# Patient Record
Sex: Female | Born: 1950 | Race: White | Hispanic: No | Marital: Married | Smoking: Former smoker
Health system: Southern US, Community
[De-identification: ages and names within clinical notes are randomized; demographics above are authoritative.]

## PROBLEM LIST (undated history)

## (undated) DIAGNOSIS — I6523 Occlusion and stenosis of bilateral carotid arteries: Secondary | ICD-10-CM

## (undated) DIAGNOSIS — K219 Gastro-esophageal reflux disease without esophagitis: Secondary | ICD-10-CM

## (undated) DIAGNOSIS — I350 Nonrheumatic aortic (valve) stenosis: Secondary | ICD-10-CM

## (undated) DIAGNOSIS — K449 Diaphragmatic hernia without obstruction or gangrene: Secondary | ICD-10-CM

## (undated) DIAGNOSIS — E785 Hyperlipidemia, unspecified: Secondary | ICD-10-CM

## (undated) DIAGNOSIS — J189 Pneumonia, unspecified organism: Secondary | ICD-10-CM

## (undated) DIAGNOSIS — G629 Polyneuropathy, unspecified: Secondary | ICD-10-CM

## (undated) HISTORY — DX: Gastro-esophageal reflux disease without esophagitis: K21.9

## (undated) HISTORY — DX: Diaphragmatic hernia without obstruction or gangrene: K44.9

## (undated) HISTORY — DX: Occlusion and stenosis of bilateral carotid arteries: I65.23

## (undated) HISTORY — DX: Hyperlipidemia, unspecified: E78.5

## (undated) HISTORY — DX: Polyneuropathy, unspecified: G62.9

## (undated) HISTORY — DX: Nonrheumatic aortic (valve) stenosis: I35.0

---

## 1983-01-30 HISTORY — PX: TUBAL LIGATION: SHX77

## 2007-06-12 ENCOUNTER — Ambulatory Visit: Payer: Self-pay | Admitting: Gastroenterology

## 2007-06-24 ENCOUNTER — Ambulatory Visit (HOSPITAL_COMMUNITY): Admission: RE | Admit: 2007-06-24 | Discharge: 2007-06-24 | Payer: Self-pay | Admitting: Gastroenterology

## 2007-06-24 ENCOUNTER — Ambulatory Visit: Payer: Self-pay | Admitting: Gastroenterology

## 2007-06-24 ENCOUNTER — Encounter: Payer: Self-pay | Admitting: Gastroenterology

## 2010-06-13 NOTE — Consult Note (Signed)
[name], [name]               ACCOUNT NO.:  [id number]   MEDICAL RECORD NO.:  [id number]          PATIENT TYPE:  AMB   LOCATION:  DAY                           FACILITY:  [facility]   PHYSICIAN:  Kassie Mends, M.D.      DATE OF BIRTH:  11/26/1950   DATE OF CONSULTATION:  DATE OF DISCHARGE:                                 CONSULTATION   REQUESTING PHYSICIAN:  Dr. Sherryll Burger.   CHIEF COMPLAINTS:  Rectal bleeding, heartburn, and indigestion.   HISTORY OF PRESENT ILLNESS:  [name] is a 60 year old female.  Approximately 2 months ago, she developed a large amount of bright red  rectal bleeding.  This was during an acute illness where she had  diarrhea as well as left-sided abdominal pain, nausea, vomiting, severe  heartburn, and indigestion which lasted several weeks.  She is doing  much better now.  However, she has still had Hemoccult positive stool  through Dr. Margaretmary Eddy office.  She is complaining of some nausea as well as  heartburn and indigestion currently.  She denies any dysphagia or  odynophagia.  She has had some anorexia.  She has been taking Tums on an  as-needed basis, which do seem to help some.  She tells me her bowel  movements alternate between what she calls constipation and diarrhea,  but generally she has a bowel movement within every 2 days or less than  5 bowel movements per day.  She has never had a colonoscopy.   PAST MEDICAL AND SURGICAL HISTORY:  She had pneumonia, which required  hospitalization and tubal ligation.   CURRENT MEDICATIONS:  1. Aspirin 81 mg, just discontinued.  2. Calcium 500 mg when she remembers to take it.   ALLERGIES:  ASPIRIN closes my throat up   FAMILY HISTORY:  There is no known family history of carcinoma or other  chronic GI problems.  Mother deceased in her 43s due to ovarian cancer.  Father deceased in his 73s secondary to MI.  She has 3 healthy brothers.   SOCIAL HISTORY:  [name] is married.  She has 2 healthy children.  She  works third shift at [employer].  She has a 40 pack-year history of  tobacco use.  Denies any alcohol or drug use.   REVIEW OF SYSTEMS:  See HPI.  GU:  She did have hematuria, but tells me  this has resolved and denies any dysuria or increased urinary frequency.  Otherwise, negative review of systems.  See HPI.   PHYSICAL EXAMINATION:  VITAL SIGNS:  Weight 104 pounds, height is 61  inches, temperature 98.2, blood pressure 104/60, and pulse 88.  GENERAL:  She is a well-developed and well-nourished Caucasian female,  in no acute distress.  HEENT:  Sclerae clear and nonicteric.  Conjunctivae are pink.  Oropharynx is pink and moist without lesions.  NECK:  Supple without any mass or thyromegaly.  CHEST:  Heart regular rhythm.  Normal S1 and S2.  No murmurs, clicks,  rubs, or gallops.  LUNGS:  Clear to auscultation bilaterally.  ABDOMEN:  Positive bowel sounds x4.  No bruits auscultated.  Soft,  nontender, and nondistended without palpable masses or hepatomegaly.  No  rebound tenderness or guarding.  EXTREMITIES:  Without clubbing or edema.   Laboratory studies from May 02, 2007, she had a normal complete  metabolic panel except for CO2 of 29, (normal LFTs.)  She had a CBC on  May 02, 2007, which was normal.   IMPRESSION:  [name] is a 60 year old female with a history of large  volume intermittent rectal bleeding which is going to require further  evaluation.  Her symptoms of nausea, left lower quadrant abdominal pain,  heartburn, indigestion, and anorexia all began about 5 months ago, when  she developed acute illness and she has had lingering symptoms since  that time.  She is going to require further evaluation to rule out  colorectal carcinoma.  There is no point tenderness or anything to  suggest diverticulitis on exam.  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  However, this does not explain her rectal bleeding,  and she should undergo colonoscopy to rule out  inflammatory bowel  disease as well as colorectal carcinoma.   She has had some worsening heartburn and indigestion, acute illness as  well as chronic nausea, so she should undergo EGD to rule out erosive  esophagitis/gastritis and peptic ulcer disease.   PLAN:  1. Begin omeprazole 20 mg daily, #31 with one refill.  2. Colonoscopy and EGD with Dr. Cira Servant in the near future.  I have      discussed both procedures including risks and benefits including      but not limited to infection, perforation, and drug reaction.  She      agrees with the plan and consent will be obtained.   Thank you Dr. Sherryll Burger for allowing [place] to participate in the care of Ms.  Foley.      Lorenza Burton, N.P.      Kassie Mends, M.D.  Electronically Signed    KJ/MEDQ  D:  06/12/2007  T:  06/13/2007  Job:  [id number]   cc:   Kirstie Peri, MD  Fax: [phone]

## 2010-06-13 NOTE — Consult Note (Signed)
[name], [name]               ACCOUNT NO.:  [id number]   MEDICAL RECORD NO.:  [id number]          PATIENT TYPE:  AMB   LOCATION:  DAY                           FACILITY:  [facility]   PHYSICIAN:  Lorenza Burton, N.P.    DATE OF BIRTH:  11-Dec-1950   DATE OF CONSULTATION:  DATE OF DISCHARGE:                                 CONSULTATION   REQUESTING PHYSICIAN:  Dr. Sherryll Burger.   CHIEF COMPLAINTS:  Rectal bleeding, heartburn, and indigestion.   HISTORY OF PRESENT ILLNESS:  [name] is a 60 year old female.  Approximately 2 months ago, she developed a large amount of bright red  rectal bleeding.  This was during an acute illness where she had  diarrhea as well as left-sided abdominal pain, nausea, vomiting, severe  heartburn, and indigestion which lasted several weeks.  She is doing  much better now.  However, she has still had Hemoccult positive stool  through Dr. Margaretmary Eddy office.  She is complaining of some nausea as well as  heartburn and indigestion currently.  She denies any dysphagia or  odynophagia.  She has had some anorexia.  She has been taking Tums on an  as-needed basis, which do seem to help some.  She tells me her bowel  movements alternate between what she calls constipation and diarrhea,  but generally she has a bowel movement within every 2 days or less than  5 bowel movements per day.  She has never had a colonoscopy.   PAST MEDICAL AND SURGICAL HISTORY:  She had pneumonia, which required  hospitalization and tubal ligation.   CURRENT MEDICATIONS:  1. Aspirin 81 mg, just discontinued.  2. Calcium 500 mg when she remembers to take it.   ALLERGIES:  ASPIRIN closes my throat up   FAMILY HISTORY:  There is no known family history of carcinoma or other  chronic GI problems.  Mother deceased in her 39s due to ovarian cancer.  Father deceased in his 72s secondary to MI.  She has 3 healthy brothers.   SOCIAL HISTORY:  [name] is married.  She has 2 healthy children.  She  works third shift at [employer].  She has a 40 pack-year history of  tobacco use.  Denies any alcohol or drug use.   REVIEW OF SYSTEMS:  See HPI.  GU:  She did have hematuria, but tells me  this has resolved and denies any dysuria or increased urinary frequency.  Otherwise, negative review of systems.  See HPI.   PHYSICAL EXAMINATION:  VITAL SIGNS:  Weight 104 pounds, height is 61  inches, temperature 98.2, blood pressure 104/60, and pulse 88.  GENERAL:  She is a well-developed and well-nourished Caucasian female,  in no acute distress.  HEENT:  Sclerae clear and nonicteric.  Conjunctivae are pink.  Oropharynx is pink and moist without lesions.  NECK:  Supple without any mass or thyromegaly.  CHEST:  Heart regular rhythm.  Normal S1 and S2.  No murmurs, clicks,  rubs, or gallops.  LUNGS:  Clear to auscultation bilaterally.  ABDOMEN:  Positive bowel sounds x4.  No bruits auscultated.  Soft,  nontender, and nondistended without palpable masses or hepatomegaly.  No  rebound tenderness or guarding.  EXTREMITIES:  Without clubbing or edema.   Laboratory studies from May 02, 2007, she had a normal complete  metabolic panel except for CO2 of 29, (normal LFTs.)  She had a CBC on  May 02, 2007, which was normal.   IMPRESSION:  [name] is a 60 year old female with a history of large  volume intermittent rectal bleeding which is going to require further  evaluation.  Her symptoms of nausea, left lower quadrant abdominal pain,  heartburn, indigestion, and anorexia all began about 5 months ago, when  she developed acute illness and she has had lingering symptoms since  that time.  She is going to require further evaluation to rule out  colorectal carcinoma.  There is no point tenderness or anything to  suggest diverticulitis on exam.  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  However, this does not explain her rectal bleeding,  and she should undergo colonoscopy to rule out  inflammatory bowel  disease as well as colorectal carcinoma.   She has had some worsening heartburn and indigestion, acute illness as  well as chronic nausea, so she should undergo EGD to rule out erosive  esophagitis/gastritis and peptic ulcer disease.   PLAN:  1. Begin omeprazole 20 mg daily, #31 with one refill.  2. Colonoscopy and EGD with Dr. Cira Servant in the near future.  I have      discussed both procedures including risks and benefits including      but not limited to infection, perforation, and drug reaction.  She      agrees with the plan and consent will be obtained.   Thank you Dr. Sherryll Burger for allowing [place] to participate in the care of Ms.  Foley.      Lorenza Burton, N.P.     KJ/MEDQ  D:  06/12/2007  T:  06/13/2007  Job:  [id number]   cc:   Kirstie Peri, MD  Fax: [phone]

## 2010-06-13 NOTE — Op Note (Signed)
[name], ENT             ACCOUNT NO.:  [id number]   MEDICAL RECORD NO.:  [id number]          PATIENT TYPE:  AMB   LOCATION:  DAY                           FACILITY:  [facility]   PHYSICIAN:  Kassie Mends, M.D.      DATE OF BIRTH:  [birth year]   DATE OF PROCEDURE:  06/24/2007  DATE OF DISCHARGE:                                PROCEDURE NOTE   REFERRING Coron Rossano:  Kirstie Peri, MD   PROCEDURE:  1. Sigmoidoscopy with cold forceps polypectomy.  2. Esophagogastroduodenoscopy with cold forceps biopsy.   INDICATION FOR EXAM:  Ms. Sorrels is a 60 year old female who presented  with rectal bleeding.  She was also complaining of left-sided abdominal  pain, nausea, vomiting, severe heartburn, and indigestion.   FINDINGS:  1. The scope was advanced to the distal transverse colon.  The patient      had severe agitation with advancing the scope, but was adequately      sedated when the scope was not moving.  Also with advancing the      scope, her heart rate dropped to 47 and her systolic blood pressure      was 75-85.  No additional medication could be given for comfort.  2. A 3-mm sessile sigmoid colon polyp removed via cold forceps.  Small      internal hemorrhoids.  Otherwise no masses, inflammatory changes,      or diverticular AVMs seen.  3. Normal esophagus without evidence of Barrett's, mass, erosion,      ulceration, or stricture.  4. A 2-cm sliding hiatal hernia.  5. Patchy erythema in the body and the antrum.  Biopsies obtained via      cold forceps to evaluate for H. pylori gastritis.  6. Normal duodenal bulb and second portion of the duodenum.   DIAGNOSES:  1. Ms. Sublett rectal bleeding is likely secondary to hemorrhoids.  2. Sigmoid colon polyp.  3. Mild gastritis.   RECOMMENDATIONS:  1. No aspirin, NSAIDs, or anticoagulation for 5 days.  She should      continue the omeprazole 20 mg, 30 minutes before her first meal.  2. We will call Ms. Oak with the results of  her biopsies.  If her      polyp is adenomatous then recommend a colonoscopy within the next 3-      6 months with a MiraLax bowel prep to complete evaluation of her      colon.  The alternative would be a double-contrast barium enema.  3. Her next endoscopy, upper or lower should be performed with      propofol.  4. She should follow a high fiber diet.  She was given a handout on      high-fiber diet, reflux, gastritis, and hemorrhoids.   MEDICATIONS:  1. Demerol 50 mg IV.  2. Versed 5 mg IV.   PROCEDURE TECHNIQUE:  Physical exam was performed.  Informed consent was  obtained from the patient after explaining the benefits, risks, and  alternatives to the procedure.  The patient was connected to the monitor  and placed in left lateral position.  Continuous oxygen was provided by  nasal cannula, IV medicine administered through an indwelling cannula.  After administration of sedation and rectal exam, the patient's rectum  was intubated and the scope advanced under direct visualization to the  distal transverse colon.  Due to the patient discomfort and in spite of  adequate sedation, the colonoscopy was changed to a sigmoidoscopy.  The  scope was removed slowly by carefully examining the color, texture,  anatomy, and integrity of the mucosa on the way out.   After the colonoscopy, the patient's esophagus was intubated with a  diagnostic gastroscope.  She had significant gagging with passing the  scope through her upper esophageal sphincter into her stomach.  Her eyes  remained closed during the exam.  The scope was advanced under direct  visualization to the second portion of the duodenum.  The scope was  removed slowly by carefully examining the color, texture, anatomy, and  integrity of the mucosa on the way out.  The patient was recovered in  endoscopy and discharged home in satisfactory condition.   PATH:  Simple adenoma. TCS in 3-6 momths with propofol. Gastric  biopsies: no H.  pylori. HF Diet & Omperazole.      Kassie Mends, M.D.  Electronically Signed     SM/MEDQ  D:  06/24/2007  T:  06/25/2007  Job:  [id number]   cc:   Kirstie Peri, MD  Fax: [phone]

## 2011-05-08 ENCOUNTER — Emergency Department (HOSPITAL_COMMUNITY): Payer: Self-pay

## 2011-05-08 ENCOUNTER — Observation Stay (HOSPITAL_COMMUNITY)
Admission: EM | Admit: 2011-05-08 | Discharge: 2011-05-10 | Disposition: A | Discharge status: Home / Self Care | Payer: Self-pay | Attending: Orthopedic Surgery | Admitting: Orthopedic Surgery

## 2011-05-08 ENCOUNTER — Encounter (HOSPITAL_COMMUNITY)
Admission: EM | Disposition: A | Discharge status: Home / Self Care | Payer: Self-pay | Source: Home / Self Care | Attending: Emergency Medicine

## 2011-05-08 ENCOUNTER — Emergency Department (HOSPITAL_COMMUNITY): Payer: Self-pay | Admitting: Anesthesiology

## 2011-05-08 ENCOUNTER — Encounter (HOSPITAL_COMMUNITY): Payer: Self-pay | Admitting: Anesthesiology

## 2011-05-08 ENCOUNTER — Encounter (HOSPITAL_COMMUNITY): Payer: Self-pay | Admitting: General Practice

## 2011-05-08 DIAGNOSIS — W19XXXA Unspecified fall, initial encounter: Secondary | ICD-10-CM | POA: Insufficient documentation

## 2011-05-08 DIAGNOSIS — S82009A Unspecified fracture of unspecified patella, initial encounter for closed fracture: Principal | ICD-10-CM | POA: Insufficient documentation

## 2011-05-08 DIAGNOSIS — S91209A Unspecified open wound of unspecified toe(s) with damage to nail, initial encounter: Secondary | ICD-10-CM

## 2011-05-08 DIAGNOSIS — R110 Nausea: Secondary | ICD-10-CM | POA: Insufficient documentation

## 2011-05-08 HISTORY — PX: ORIF PATELLA: SHX5033

## 2011-05-08 HISTORY — DX: Pneumonia, unspecified organism: J18.9

## 2011-05-08 SURGERY — OPEN REDUCTION INTERNAL FIXATION (ORIF) PATELLA
Anesthesia: General | Site: Knee | Laterality: Right | Wound class: Clean

## 2011-05-08 MED ORDER — PROPOFOL 10 MG/ML IV EMUL
INTRAVENOUS | Status: DC | PRN
  Administered 2011-05-08: 110 mg via INTRAVENOUS

## 2011-05-08 MED ORDER — METOCLOPRAMIDE HCL 5 MG/ML IJ SOLN
INTRAMUSCULAR | Status: DC | PRN
  Administered 2011-05-08: 10 mg via INTRAVENOUS

## 2011-05-08 MED ORDER — HYDROMORPHONE HCL PF 1 MG/ML IJ SOLN: 0.5000 mg | INTRAMUSCULAR | Status: DC | PRN

## 2011-05-08 MED ORDER — BUPIVACAINE-EPINEPHRINE (PF) 0.5% -1:200000 IJ SOLN
INTRAMUSCULAR | Status: AC
  Filled 2011-05-08: qty 10

## 2011-05-08 MED ORDER — BUPIVACAINE HCL (PF) 0.25 % IJ SOLN
INTRAMUSCULAR | Status: AC
  Filled 2011-05-08: qty 30

## 2011-05-08 MED ORDER — 0.9 % SODIUM CHLORIDE (POUR BTL) OPTIME
TOPICAL | Status: DC | PRN
  Administered 2011-05-08: 1000 mL

## 2011-05-08 MED ORDER — LACTATED RINGERS IV SOLN
INTRAVENOUS | Status: DC | PRN
  Administered 2011-05-08: 21:00:00 via INTRAVENOUS

## 2011-05-08 MED ORDER — PROMETHAZINE HCL 25 MG/ML IJ SOLN: 6.2500 mg | INTRAMUSCULAR | Status: DC | PRN

## 2011-05-08 MED ORDER — DOCUSATE SODIUM 100 MG PO CAPS
100.0000 mg | ORAL_CAPSULE | Freq: Two times a day (BID) | ORAL | Status: DC
  Administered 2011-05-09 – 2011-05-10 (×2): 100 mg via ORAL
  Filled 2011-05-08 (×4): qty 1

## 2011-05-08 MED ORDER — MORPHINE SULFATE 2 MG/ML IJ SOLN: 1.0000 mg | INTRAMUSCULAR | Status: DC | PRN

## 2011-05-08 MED ORDER — HYDROMORPHONE HCL PF 1 MG/ML IJ SOLN: 0.2500 mg | INTRAMUSCULAR | Status: DC | PRN

## 2011-05-08 MED ORDER — METOCLOPRAMIDE HCL 10 MG PO TABS: 5.0000 mg | ORAL_TABLET | Freq: Three times a day (TID) | ORAL | Status: DC | PRN

## 2011-05-08 MED ORDER — SODIUM CHLORIDE 0.9 % IV SOLN
INTRAVENOUS | Status: DC
  Administered 2011-05-08: 75 mL via INTRAVENOUS

## 2011-05-08 MED ORDER — BUPIVACAINE HCL 0.5 % IJ SOLN
INTRAMUSCULAR | Status: DC | PRN
  Administered 2011-05-08: 20 mL

## 2011-05-08 MED ORDER — METOCLOPRAMIDE HCL 5 MG/ML IJ SOLN
5.0000 mg | Freq: Three times a day (TID) | INTRAMUSCULAR | Status: DC | PRN
  Administered 2011-05-09: 10 mg via INTRAVENOUS
  Filled 2011-05-08: qty 2

## 2011-05-08 MED ORDER — MORPHINE SULFATE 4 MG/ML IJ SOLN
4.0000 mg | Freq: Once | INTRAMUSCULAR | Status: DC
  Filled 2011-05-08: qty 1

## 2011-05-08 MED ORDER — FENTANYL CITRATE 0.05 MG/ML IJ SOLN
INTRAMUSCULAR | Status: DC | PRN
  Administered 2011-05-08 (×3): 50 ug via INTRAVENOUS
  Administered 2011-05-08 (×2): 25 ug via INTRAVENOUS

## 2011-05-08 MED ORDER — MIDAZOLAM HCL 5 MG/5ML IJ SOLN
INTRAMUSCULAR | Status: DC | PRN
  Administered 2011-05-08: 2 mg via INTRAVENOUS

## 2011-05-08 MED ORDER — BUPIVACAINE HCL (PF) 0.5 % IJ SOLN
INTRAMUSCULAR | Status: AC
  Filled 2011-05-08: qty 30

## 2011-05-08 MED ORDER — ONDANSETRON HCL 4 MG/2ML IJ SOLN
4.0000 mg | Freq: Four times a day (QID) | INTRAMUSCULAR | Status: DC | PRN
  Filled 2011-05-08: qty 2

## 2011-05-08 MED ORDER — HYDROCODONE-ACETAMINOPHEN 7.5-325 MG PO TABS
1.0000 | ORAL_TABLET | ORAL | Status: DC | PRN
  Administered 2011-05-09 (×2): 2 via ORAL
  Filled 2011-05-08 (×2): qty 2

## 2011-05-08 MED ORDER — HYDROCODONE-ACETAMINOPHEN 5-325 MG PO TABS: 1.0000 | ORAL_TABLET | Freq: Four times a day (QID) | ORAL | Status: AC | PRN

## 2011-05-08 MED ORDER — ONDANSETRON HCL 4 MG/2ML IJ SOLN
4.0000 mg | Freq: Four times a day (QID) | INTRAMUSCULAR | Status: DC | PRN
  Administered 2011-05-09: 4 mg via INTRAVENOUS

## 2011-05-08 MED ORDER — CEFAZOLIN SODIUM 1-5 GM-% IV SOLN
1.0000 g | INTRAVENOUS | Status: AC
  Administered 2011-05-08: 1 g via INTRAVENOUS
  Filled 2011-05-08: qty 50

## 2011-05-08 MED ORDER — BACITRACIN ZINC 500 UNIT/GM EX OINT
TOPICAL_OINTMENT | CUTANEOUS | Status: AC
  Filled 2011-05-08: qty 15

## 2011-05-08 MED ORDER — ONDANSETRON HCL 4 MG PO TABS: 4.0000 mg | ORAL_TABLET | Freq: Four times a day (QID) | ORAL | Status: DC | PRN

## 2011-05-08 MED ORDER — SODIUM CHLORIDE 0.9 % IV SOLN
INTRAVENOUS | Status: DC
  Administered 2011-05-08 – 2011-05-10 (×2): via INTRAVENOUS

## 2011-05-08 MED ORDER — TETANUS-DIPHTH-ACELL PERTUSSIS 5-2.5-18.5 LF-MCG/0.5 IM SUSP
0.5000 mL | Freq: Once | INTRAMUSCULAR | Status: AC
  Administered 2011-05-08: 0.5 mL via INTRAMUSCULAR
  Filled 2011-05-08: qty 0.5

## 2011-05-08 MED ORDER — SENNA 8.6 MG PO TABS
1.0000 | ORAL_TABLET | Freq: Two times a day (BID) | ORAL | Status: DC
  Administered 2011-05-09 – 2011-05-10 (×2): 8.6 mg via ORAL
  Filled 2011-05-08 (×4): qty 1

## 2011-05-08 SURGICAL SUPPLY — 63 items
BAG ZIPLOCK 12X15 (MISCELLANEOUS) IMPLANT
BANDAGE ELASTIC 4 VELCRO ST LF (GAUZE/BANDAGES/DRESSINGS) IMPLANT
BANDAGE ELASTIC 6 VELCRO ST LF (GAUZE/BANDAGES/DRESSINGS) IMPLANT
BANDAGE GAUZE ELAST BULKY 4 IN (GAUZE/BANDAGES/DRESSINGS) IMPLANT
BIT DRILL 2.9 CANN QC NONSTRL (BIT) ×3 IMPLANT
BNDG COHESIVE 4X5 TAN STRL (GAUZE/BANDAGES/DRESSINGS) IMPLANT
BNDG ELASTIC 6X15 VLCR STRL LF (GAUZE/BANDAGES/DRESSINGS) ×6 IMPLANT
CLOTH BEACON ORANGE TIMEOUT ST (SAFETY) ×3 IMPLANT
CUFF TOURN SGL QUICK 34 (TOURNIQUET CUFF) ×1
CUFF TRNQT CYL 34X4X40X1 (TOURNIQUET CUFF) ×2 IMPLANT
DECANTER SPIKE VIAL GLASS SM (MISCELLANEOUS) IMPLANT
DRAPE C-ARM 42X72 X-RAY (DRAPES) ×3 IMPLANT
DRAPE OEC MINIVIEW 54X84 (DRAPES) IMPLANT
DRAPE U-SHAPE 47X51 STRL (DRAPES) ×3 IMPLANT
DRSG ADAPTIC 3X8 NADH LF (GAUZE/BANDAGES/DRESSINGS) ×3 IMPLANT
DRSG PAD ABDOMINAL 8X10 ST (GAUZE/BANDAGES/DRESSINGS) ×3 IMPLANT
DURAPREP 26ML APPLICATOR (WOUND CARE) ×3 IMPLANT
ELECT REM PT RETURN 9FT ADLT (ELECTROSURGICAL) ×3
ELECTRODE REM PT RTRN 9FT ADLT (ELECTROSURGICAL) ×2 IMPLANT
FACESHIELD LNG OPTICON STERILE (SAFETY) IMPLANT
GLOVE BIO SURGEON STRL SZ8 (GLOVE) ×3 IMPLANT
GLOVE BIOGEL PI IND STRL 7.0 (GLOVE) ×2 IMPLANT
GLOVE BIOGEL PI IND STRL 8 (GLOVE) ×2 IMPLANT
GLOVE BIOGEL PI INDICATOR 7.0 (GLOVE) ×1
GLOVE BIOGEL PI INDICATOR 8 (GLOVE) ×1
GLOVE SURG ORTHO 8.5 STRL (GLOVE) IMPLANT
GLOVE SURG SS PI 6.5 STRL IVOR (GLOVE) ×3 IMPLANT
GOWN BRE IMP SLV AUR XL STRL (GOWN DISPOSABLE) ×3 IMPLANT
GOWN SRG XL XLNG 56XLVL 4 (GOWN DISPOSABLE) ×2 IMPLANT
GOWN STRL NON-REIN XL XLG LVL4 (GOWN DISPOSABLE) ×1
IV CATH 14GX2 1/4 (CATHETERS) ×3 IMPLANT
K-WIRE ACE 1.6X6 (WIRE) ×9
KIT BASIN OR (CUSTOM PROCEDURE TRAY) ×3 IMPLANT
KWIRE ACE 1.6X6 (WIRE) ×6 IMPLANT
MANIFOLD NEPTUNE II (INSTRUMENTS) ×3 IMPLANT
NEEDLE HYPO 22GX1.5 SAFETY (NEEDLE) ×3 IMPLANT
NEEDLE MAYO .5 CIRCLE (NEEDLE) IMPLANT
NS IRRIG 1000ML POUR BTL (IV SOLUTION) ×3 IMPLANT
PACK LOWER EXTREMITY WL (CUSTOM PROCEDURE TRAY) ×3 IMPLANT
PAD CAST 4YDX4 CTTN HI CHSV (CAST SUPPLIES) IMPLANT
PADDING CAST COTTON 4X4 STRL (CAST SUPPLIES)
PADDING CAST COTTON 6X4 STRL (CAST SUPPLIES) ×9 IMPLANT
PLATE SPIDER 16 (Washer) ×6 IMPLANT
POSITIONER SURGICAL ARM (MISCELLANEOUS) ×3 IMPLANT
SCREW ACE CAN 4.0 44M (Screw) ×3 IMPLANT
SCREW ACE CAN 4.0 46M (Screw) ×3 IMPLANT
SPONGE GAUZE 4X4 12PLY (GAUZE/BANDAGES/DRESSINGS) ×3 IMPLANT
SPONGE LAP 18X18 X RAY DECT (DISPOSABLE) ×6 IMPLANT
SPONGE LAP 4X18 X RAY DECT (DISPOSABLE) IMPLANT
STAPLER VISISTAT 35W (STAPLE) ×3 IMPLANT
STRIP CLOSURE SKIN 1/2X4 (GAUZE/BANDAGES/DRESSINGS) IMPLANT
SUCTION FRAZIER TIP 10 FR DISP (SUCTIONS) ×3 IMPLANT
SUT ETHIBOND NAB BRD #0 18IN (SUTURE) ×3 IMPLANT
SUT ETHILON 4 0 PS 2 18 (SUTURE) IMPLANT
SUT FIBERWIRE #5 38 BLUE (WIRE) ×3 IMPLANT
SUT PROLENE 3 0 PS 2 (SUTURE) ×3 IMPLANT
SUT VIC AB 0 CT1 36 (SUTURE) ×6 IMPLANT
SUT VIC AB 2-0 CT1 27 (SUTURE) ×2
SUT VIC AB 2-0 CT1 TAPERPNT 27 (SUTURE) ×4 IMPLANT
SUT VIC AB 3-0 PS2 18 (SUTURE)
SUT VIC AB 3-0 PS2 18XBRD (SUTURE) IMPLANT
SYR CONTROL 10ML LL (SYRINGE) ×3 IMPLANT
WATER STERILE IRR 1500ML POUR (IV SOLUTION) IMPLANT

## 2011-05-08 NOTE — Anesthesia Postprocedure Evaluation (Signed)
  Anesthesia Post-op Note  Patient: [name]  Procedure(s) Performed: Procedure(s) (LRB): OPEN REDUCTION INTERNAL (ORIF) FIXATION PATELLA (Right)  Patient Location: PACU  Anesthesia Type: General  Level of Consciousness: awake and alert   Airway and Oxygen Therapy: Patient Spontanous Breathing  Post-op Pain: mild  Post-op Assessment: Post-op Vital signs reviewed, Patient's Cardiovascular Status Stable, Respiratory Function Stable, Patent Airway and No signs of Nausea or vomiting  Post-op Vital Signs: stable  Complications: No apparent anesthesia complications

## 2011-05-08 NOTE — ED Provider Notes (Signed)
History     CSN: [id number]  Arrival date & time 05/08/11  1541   First MD Initiated Contact with Patient 05/08/11 1547      Chief Complaint  Patient presents with  . Fall    (Consider location/radiation/quality/duration/timing/severity/associated sxs/prior treatment) Patient is a 61 y.o. female presenting with fall. The history is provided by the patient.  Fall The accident occurred 1 to 2 hours ago. The fall occurred while walking. Pain location: right knee. The pain is moderate. She was not ambulatory at the scene. Pertinent negatives include no fever, no numbness, no abdominal pain, no vomiting, no headaches and no loss of consciousness. The symptoms are aggravated by flexion.  Pt states she had fallen about a week ago and was recently just returning back to work.  When she was walking today her right knee suddenly gave out.  Pt is unable to extend her leg.  No past medical history on file.  Past Surgical History  Procedure Date  . Tubal ligation 1985    No family history on file.  History  Substance Use Topics  . Smoking status: Former Smoker -- 1.0 packs/day for 40 years    Types: Cigarettes    Quit date: 01/27/2011  . Smokeless tobacco: Not on file  . Alcohol Use: No    OB History    Grav Para Term Preterm Abortions TAB SAB Ect Mult Living                  Review of Systems  Constitutional: Negative for fever.  Gastrointestinal: Negative for vomiting and abdominal pain.  Neurological: Negative for loss of consciousness, numbness and headaches.    Allergies  Aspirin  Home Medications  No current outpatient prescriptions on file.  BP 147/78  Pulse 75  Temp(Src) 98.5 F (36.9 C) (Oral)  Resp 16  SpO2 99%  Physical Exam  Nursing note and vitals reviewed. Constitutional: She appears well-developed and well-nourished. No distress.  HENT:  Head: Normocephalic and atraumatic.  Right Ear: External ear normal.  Left Ear: External ear normal.  Eyes:  Conjunctivae are normal. Right eye exhibits no discharge. Left eye exhibits no discharge. No scleral icterus.  Neck: Neck supple. No tracheal deviation present.  Cardiovascular: Normal rate, regular rhythm and normal heart sounds.  Exam reveals no gallop.   No murmur heard. Pulmonary/Chest: Effort normal and breath sounds normal. No stridor. No respiratory distress.  Musculoskeletal: She exhibits no edema.       Right knee: She exhibits decreased range of motion, effusion, deformity and abnormal patellar mobility. She exhibits no laceration, no erythema and normal alignment.       Right foot: She exhibits laceration (partial toenail avulsion).       Pt unable to extend her lower leg, unable to keep her leg held straight against gravity, defect noted proximal to patella  Neurological: She is alert. Cranial nerve deficit: no gross deficits.  Skin: Skin is warm and dry. No rash noted.  Psychiatric: She has a normal mood and affect.    ED Course  Procedures (including critical care time)  Labs Reviewed - No data to display Dg Knee Complete 4 Views Right  05/08/2011  *RADIOLOGY REPORT*  Clinical Data: Fall, diffuse pain  RIGHT KNEE - COMPLETE 4+ VIEW  Comparison: None.  Findings: Four views of the right knee submitted.  There is displaced fracture of the patella.  Large joint effusion. Significant prepatellar soft tissue swelling.  IMPRESSION: Displaced avulsed fracture of the patella.  Large joint effusion. Soft tissue swelling.  Original Report Authenticated By: Natasha Mead, M.D.     1. Fracture of patella, closed   2. Toenail avulsion       MDM  Symptoms suspicious for quadricept tendon rupture.  Will check x-rays.  X-rays reveal a patella fracture with significant displacement. I have  consulted Dr. Victorino Dike, orthopedics.  He will come and evaluate  [name] in the emergency department in anticipation of possible surgery.     Alexandra Kras, MD 05/08/11 [phone]

## 2011-05-08 NOTE — Transfer of Care (Signed)
Immediate Anesthesia Transfer of Care Note  Patient: [name]  Procedure(s) Performed: Procedure(s) (LRB): OPEN REDUCTION INTERNAL (ORIF) FIXATION PATELLA (Right)  Patient Location: PACU  Anesthesia Type: General  Level of Consciousness: sedated and patient cooperative  Airway & Oxygen Therapy: Patient Spontanous Breathing and Patient connected to face mask oxygen  Post-op Assessment: Report given to PACU RN and Post -op Vital signs reviewed and stable  Post vital signs: Reviewed and stable  Complications: No apparent anesthesia complications

## 2011-05-08 NOTE — ED Notes (Signed)
Admission MD in room 

## 2011-05-08 NOTE — ED Notes (Signed)
At this moment, this patient is waiting for Surgeon to evaluate for a possible surgery. Ortho tech has called for knee immobilizer and crutches to stabilize her right knee

## 2011-05-08 NOTE — ED Notes (Signed)
ZOX:[id number]<VW> Expected date:05/08/11<BR> Expected time: 3:44 PM<BR> Means of arrival:Ambulance<BR> Comments:<BR> Rock 5. 61 yo f. Fall, right knee injury, edema and pain. 15 mins

## 2011-05-08 NOTE — ED Notes (Signed)
MD at bedside. 

## 2011-05-08 NOTE — ED Notes (Signed)
[id number]<AV> Expected date:<BR> Expected time:<BR> Means of arrival:<BR> Comments:<BR> Cleaning

## 2011-05-08 NOTE — Anesthesia Preprocedure Evaluation (Signed)
Anesthesia Evaluation  Patient identified by MRN, date of birth, ID band Patient awake    Reviewed: Allergy & Precautions, H&P , NPO status , Patient's Chart, lab work & pertinent test results  Airway Mallampati: II TM Distance: >3 FB Neck ROM: Full    Dental No notable dental hx.    Pulmonary pneumonia , former smoker Pneumonia 2006. Quit smoking 3 months ago. breath sounds clear to auscultation  Pulmonary exam normal       Cardiovascular negative cardio ROS  Rhythm:Regular Rate:Normal     Neuro/Psych negative neurological ROS  negative psych ROS   GI/Hepatic negative GI ROS, Neg liver ROS,   Endo/Other  negative endocrine ROS  Renal/GU negative Renal ROS  negative genitourinary   Musculoskeletal negative musculoskeletal ROS (+)   Abdominal   Peds negative pediatric ROS (+)  Hematology negative hematology ROS (+)   Anesthesia Other Findings   Reproductive/Obstetrics negative OB ROS                           Anesthesia Physical Anesthesia Plan  ASA: II  Anesthesia Plan: General   Post-op Pain Management:    Induction: Intravenous  Airway Management Planned: Oral ETT  Additional Equipment:   Intra-op Plan:   Post-operative Plan: Extubation in OR  Informed Consent: I have reviewed the patients History and Physical, chart, labs and discussed the procedure including the risks, benefits and alternatives for the proposed anesthesia with the patient or authorized representative who has indicated his/her understanding and acceptance.   Dental advisory given  Plan Discussed with: CRNA  Anesthesia Plan Comments:         Anesthesia Quick Evaluation

## 2011-05-08 NOTE — ED Notes (Signed)
Pt taken to Sx, pt belongings including glasses, dentures, and wedding band given to spouse, Sx team made aware need of chest xray, xray bedside to complete

## 2011-05-08 NOTE — ED Notes (Addendum)
Pt brought by [facility]. Per EMS, pt was walking down some steps, right knee gave out and pt went down on last step.  Pt felt a "pop" in the knee at that time. Pt told EMS that her right knee pain was a 6 out of 10 at the time of fall, but that as long as she keeps it still, it's a 0 out of 10.  Pt has a previous fall on Sunday, landed on her right knee then, too. No LOC, does not take blood thinner.

## 2011-05-08 NOTE — Brief Op Note (Signed)
05/08/2011  10:11 PM  PATIENT:  [name]  61 y.o. female  PRE-OPERATIVE DIAGNOSIS:  fractured right patella  POST-OPERATIVE DIAGNOSIS:  FRACTURED RIGHT PATELLA  Procedure(s): 1.  OPEN REDUCTION INTERNAL (ORIF) FIXATION PATELLA 2.  Fluoro  SURGEON:  Toni Arthurs, MD  ASSISTANT: n/a  ANESTHESIA:   General  EBL:  minimal   TOURNIQUET:  55 min at 225 mm Hg  COMPLICATIONS:  None apparent  DISPOSITION:  Extubated, awake and stable to recovery.  DICTATION ID:  [id number]

## 2011-05-08 NOTE — H&P (Signed)
[name] is an 61 y.o. female.   Chief Complaint:  Right knee pain HPI:  61 y/o female without significant pmh fell Sunday on right knee.  C/o pain in right knee since then, though she had gotten to where she could walk pretty well.  She was descending stairs when her knee gave way this afternoon.  She c/o moderate pain in the knee that is worse when trying to extend the knee and better when keeping it still.  She denies any h/o diabetes, hypothyroidism and quit smoking in December after many years.  She denies any use of blood thinners.  Past Medical History  Diagnosis Date  . Pneumonia approx. 2006    Past Surgical History  Procedure Date  . Tubal ligation 1985    History reviewed. No pertinent family history. Social History:  reports that she quit smoking about 3 months ago. Her smoking use included Cigarettes. She has a 40 pack-year smoking history. She has never used smokeless tobacco. She reports that she does not drink alcohol or use illicit drugs.  Allergies:  Allergies  Allergen Reactions  . Aspirin     Medications Prior to Admission  Medication Dose Route Frequency Provider Last Rate Last Dose  . morphine 4 MG/ML injection 4 mg  4 mg Intravenous Once Celene Kras, MD      . Lady Gary Leda Min) injection 0.5 mL  0.5 mL Intramuscular Once Celene Kras, MD   0.5 mL at 06/04/11 1633   No current outpatient prescriptions on file as of 06-04-11.    No results found for this or any previous visit (from the past 48 hour(s)). Dg Knee Complete 4 Views Right  06-04-11  *RADIOLOGY REPORT*  Clinical Data: Fall, diffuse pain  RIGHT KNEE - COMPLETE 4+ VIEW  Comparison: None.  Findings: Four views of the right knee submitted.  There is displaced fracture of the patella.  Large joint effusion. Significant prepatellar soft tissue swelling.  IMPRESSION: Displaced avulsed fracture of the patella.  Large joint effusion. Soft tissue swelling.  Original Report Authenticated By: [name],  M.D.    ROS  No recent f/c/n/v/wt loss.  Blood pressure 147/78, pulse 75, temperature 98.5 F (36.9 C), temperature source Oral, resp. rate 16, SpO2 99.00%. Physical Exam wn wd woman in nad.  A and o x 4.  Mood and affect normal.  EOMI.  Respirations unlaboerd.  R knee with swelling.  TTP at patella.  Unable to extend knee.  5/5 strength in DF / PF of ankle.  Feels LT normally throughout foot.  2+ dp and PT pulses.  Assessment/Plan R patella fracture:  Explained the nature of the injury to the pt in detail.  I believe this requires operative treatment to enable her to walk again.  She understands the risks and benefits of the alternative treatment options and would like to proceed with surgical treatment.  She specifically understands risks of bleeding, infection, nerve damage, need for additional surgery, amputation and death.  [name] 06-04-11, 6:32 PM

## 2011-05-09 LAB — CREATININE, SERUM: GFR calc Af Amer: >90 mL/min (ref >90)

## 2011-05-09 MED ORDER — TRAMADOL HCL 50 MG PO TABS
50.0000 mg | ORAL_TABLET | ORAL | Status: DC | PRN
  Administered 2011-05-09 – 2011-05-10 (×2): 50 mg via ORAL
  Filled 2011-05-09 (×2): qty 1

## 2011-05-09 MED ORDER — PROMETHAZINE HCL 25 MG/ML IJ SOLN: 12.5000 mg | Freq: Four times a day (QID) | INTRAMUSCULAR | Status: DC | PRN

## 2011-05-09 MED ORDER — RIVAROXABAN 10 MG PO TABS
10.0000 mg | ORAL_TABLET | Freq: Every day | ORAL | Status: DC
  Administered 2011-05-09 – 2011-05-10 (×2): 10 mg via ORAL
  Filled 2011-05-09 (×2): qty 1

## 2011-05-09 MED ORDER — MORPHINE SULFATE 4 MG/ML IJ SOLN
4.0000 mg | INTRAMUSCULAR | Status: DC | PRN
  Filled 2011-05-09: qty 1

## 2011-05-09 NOTE — Progress Notes (Signed)
Subjective: 1 Day Post-Op Procedure(s) (LRB): OPEN REDUCTION INTERNAL (ORIF) FIXATION PATELLA (Right) Patient reports pain as mild.  Pt c/o nausea.  No significant improvement with reglan or zofran.  Denies f/c.   Objective: Vital signs in last 24 hours: Temp:  [98.3 F (36.8 C)-99 F (37.2 C)] 98.3 F (36.8 C) (04/10 1005) Pulse Rate:  [60-110] 85  (04/10 1005) Resp:  [8-16] 16  (04/10 1005) BP: (101-147)/(60-78) 137/64 mmHg (04/10 1005) SpO2:  [98 %-100 %] 98 % (04/10 1005) Weight:  [61.689 kg (136 lb)] 61.689 kg (136 lb) (04/09 2320)  Intake/Output from previous day: 04/09 0701 - 04/10 0700 In: 1671.3 [P.O.:240; I.V.:1431.3] Out: 350 [Urine:350] Intake/Output this shift: Total I/O In: 240 [P.O.:240] Out: -   No results found for this basename: HGB:5 in the last 72 hours No results found for this basename: WBC:2,RBC:2,HCT:2,PLT:2 in the last 72 hours No results found for this basename: NA:2,K:2,CL:2,CO2:2,BUN:2,CREATININE:2,GLUCOSE:2,CALCIUM:2 in the last 72 hours No results found for this basename: LABPT:2,INR:2 in the last 72 hours  Wound dressed and dry.  NVI R LE.  Assessment/Plan: 1 Day Post-Op Procedure(s) (LRB): OPEN REDUCTION INTERNAL (ORIF) FIXATION PATELLA (Right) Up with therapy  We'll try phenergan.  Hopefully d/c home tomorrow.  Toni Arthurs 05/09/2011, 12:51 PM

## 2011-05-09 NOTE — Progress Notes (Signed)
CSW consulted due to pt lacking health insurance. Financial counselor contacted and is assisting pt with insurance issues.   Cori Razor  LCSW [phone]

## 2011-05-09 NOTE — Evaluation (Signed)
Physical Therapy Evaluation Patient Details Name: [name] MRN: [id number] DOB: Jun 27, 1950 Today's Date: 05/09/2011  Problem List: There is no problem list on file for this patient.   Past Medical History:  Past Medical History  Diagnosis Date  . Pneumonia approx. 2006   Past Surgical History:  Past Surgical History  Procedure Date  . Tubal ligation 1985    PT Assessment/Plan/Recommendation PT Assessment Clinical Impression Statement: pt s/p ORIF R fx patella, has been very N/V today, limited activity. pt has SW at home, it should be adequate as pt is self pay. pt is WBAT. pt will benefit from PT to improve in functioal mobility  to DC home. PT Recommendation/Assessment: Patient will need skilled PT in the acute care venue PT Problem List: Decreased strength;Decreased activity tolerance;Decreased knowledge of use of DME;Decreased knowledge of precautions;Decreased mobility PT Therapy Diagnosis : Difficulty walking;Acute pain PT Plan PT Frequency: Min 6X/week PT Treatment/Interventions: DME instruction;Gait training;Stair training;Functional mobility training;Patient/family education PT Recommendation Follow Up Recommendations: No PT follow up Equipment Recommended: 3 in 1 bedside comode (may not need 3 in 1) PT Goals  Acute Rehab PT Goals PT Goal Formulation: With patient Time For Goal Achievement: 7 days Pt will go Supine/Side to Sit: Independently PT Goal: Supine/Side to Sit - Progress: Goal set today Pt will go Sit to Supine/Side: Independently PT Goal: Sit to Supine/Side - Progress: Goal set today Pt will go Sit to Stand: with supervision PT Goal: Sit to Stand - Progress: Goal set today Pt will go Stand to Sit: with supervision PT Goal: Stand to Sit - Progress: Goal set today Pt will Ambulate: 16 - 50 feet;with supervision;with standard walker PT Goal: Ambulate - Progress: Goal set today Pt will Go Up / Down Stairs: 3-5 stairs;with min assist;with least  restrictive assistive device PT Goal: Up/Down Stairs - Progress: Goal set today  PT Evaluation Precautions/Restrictions  Precautions Precautions: Knee Precaution Comments: no ROM to R knee Required Braces or Orthoses: Yes Other Brace/Splint: hinged brace locked in 0 extension/flexion at all times Prior Functioning  Home Living Lives With: Spouse;Son [place] Help From: Family Type of Home: House Home Layout: One level Home Access: Stairs to enter Entrance Stairs-Rails: None Secretary/administrator of Steps: 4 Bathroom Toilet: Standard Home Adaptive Equipment: Crutches Additional Comments: pt should do well w/ SW. 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 Arousal/Alertness: Awake/alert Overall Cognitive Status: Appears within functional limits for tasks assessed Orientation Level: Oriented X4 Sensation/Coordination Sensation Light Touch: Appears Intact Extremity Assessment RLE AROM (degrees) RLE Overall AROM Comments: in locked brace RLE Strength RLE Overall Strength Comments: able to lift LE from bed w/o assist LLE Assessment LLE Assessment: Within Functional Limits Mobility (including Balance) Bed Mobility Bed Mobility: Yes Supine to Sit: 4: Min assist;HOB flat Supine to Sit Details (indicate cue type and reason): support RLE  Sit to Supine: 4: Min assist Sit to Supine - Details (indicate cue type and reason): vc for support RLE onto bed Transfers Transfers: Yes Sit to Stand: 4: Min assist;From bed;From chair/3-in-1;With upper extremity assist Sit to Stand Details (indicate cue type and reason): vc for safety Stand to Sit: 4: Min assist;To bed;To chair/3-in-1 Stand to Sit Details: vc to reach back Stand Pivot Transfers: 4: Min Actuary Details (indicate cue type and reason): from bed to  [facility] Ambulation/Gait Ambulation/Gait: Yes Ambulation/Gait Assistance: 3: Mod assist Ambulation/Gait Assistance Details (  indicate cue type and reason): vc to hurry back to bed afte 8 feet due to feeling very dizzy, vc for WBAT Ambulation Distance (Feet): 8 Feet Assistive device: Standard walker Gait Pattern: Step-to pattern    Exercise    End of Session PT - End of Session Equipment Utilized During Treatment: Other (comment) (R Locked huinge brace) Activity Tolerance: Treatment limited secondary to medical complications (Comment);Other (comment) (pt with N?V) Patient left: in bed Nurse Communication: Mobility status for transfers (pt very nauseated/ ) General Behavior During Session: Restless Cognition: [facility] for tasks performed  Rada Hay 05/09/2011, 5:09 PM  [phone]

## 2011-05-09 NOTE — Progress Notes (Signed)
Patient states that her pain level is beginning to increase. There were no PRN orders for pain r/t projectile n/v during the day.  N/V has subsided, on call PA Alphonsa Overall called for pain meds. Orders received for Ultram 50mg  q4h prn pain and morphine 4mg  IV q3h prn pain. Will continue to monitor patient.

## 2011-05-09 NOTE — Op Note (Signed)
[name], [name]             ACCOUNT NO.:  [id number]  MEDICAL RECORD NO.:  [id number]  LOCATION:  1619                         FACILITY:  [facility]  PHYSICIAN:  Toni Arthurs, MD        DATE OF BIRTH:  May 17, 1950  DATE OF PROCEDURE:  05/08/2011 DATE OF DISCHARGE:                              OPERATIVE REPORT   PREOPERATIVE DIAGNOSIS:  Right patella fracture.  POSTOPERATIVE DIAGNOSIS:  Right patella fracture.  PROCEDURES: 1. Open reduction and internal fixation of right patella fracture. 2. Intraoperative interpretation of fluoroscopic imaging.  SURGEON:  Toni Arthurs, MD.  ANESTHESIA:  General.  ESTIMATED BLOOD LOSS:  Minimal.  TOURNIQUET TIME:  55 minutes at 225 mmHg.  COMPLICATIONS:  None apparent.  DISPOSITION:  Extubated, awake, and stable to recovery.  INDICATIONS FOR PROCEDURE:  The patient is a 61 year old female who fell 2 days ago onto her right knee.  She had difficulty walking.  She was walking down some stairs today when her knee buckled and she was unable to bear weight.  X-rays in the emergency department revealed a comminuted fracture of the patella.  She presents now for operative treatment of this injury.  She understands the risks and benefits, the alternatives of treatment options and elects surgical treatment.  She specifically understands risks of bleeding, infection, nerve damage, blood clots, need for additional surgery, amputation, and death.  PROCEDURE IN DETAIL:  After preoperative consent was obtained, the correct operative site was identified and the patient was brought to the operating room and placed supine on the operating table.  General anesthesia was induced.  Preoperative antibiotics were administered. Surgical time-out was taken.  Right lower extremity was prepped and draped in the standard sterile fashion with a tourniquet around the thigh.  The extremity was exsanguinated and the tourniquet was inflated to 225 mmHg.  A longitudinal  incision was made over the patella.  Sharp dissection was carried down through the skin and subcutaneous tissue to the level of the extensor retinaculum, it was incised in line with its fibers.  The retinaculum was noted to be disrupted transversely at approximately the junction of the proximal 3/4 and distal 1/4 of the patella.  The patella fracture was irrigated and cleaned of all hematoma.  The fracture was reduced and held with a tenaculum.  Two K- wires were inserted across the fracture site.  AP and lateral views were obtained showing appropriate reduction of the fracture fragments.  The lateral guidepin was overdrilled.  A cannulated 4 mm screw with a spiked washer was then placed over the guide pin.  It was noted to compress the fracture fragments appropriately.  This process was repeated for the second K-wire.  The AP and lateral views were then obtained showing appropriate reduction of the fracture fragments and appropriate position and length of both screws.  A #5 FiberWire was then passed through the hole in the screw.  It was criss-crossed over the fracture site and then passed through the other screw creating a tension band construct across the fracture site.  The retinaculum was then repaired with simple sutures of 0 Vicryl.  The extensor retinaculum incision was closed with inverted simple sutures of 0  Vicryl.  Subcutaneous tissue was closed with inverted simple sutures of 2-0 Vicryl.  A running 3-0 Prolene suture was used to close the skin incision.  Sterile dressings were applied followed by compression wrap.  A knee immobilizer was applied. Prior to closure, approximately 20 mL of 0.25% Marcaine was infiltrated into the subcutaneous tissues.  The patient was then awakened from anesthesia and transported to recovery room in stable condition.  The tourniquet had been released at 55 minutes after application of the dressings.  FOLLOWUP PLAN:  The patient will be admitted  to the hospital for observation overnight for pain control.  She will have physical therapy tomorrow for ambulation and subsequently discharged to home at that time.     Toni Arthurs, MD     JH/MEDQ  D:  05/08/2011  T:  05/09/2011  Job:  [id number]

## 2011-05-10 MED ORDER — TRAMADOL HCL 50 MG PO TABS: 50.0000 mg | ORAL_TABLET | Freq: Three times a day (TID) | ORAL | Status: AC | PRN

## 2011-05-10 MED ORDER — RIVAROXABAN 10 MG PO TABS: 10.0000 mg | ORAL_TABLET | Freq: Every day | ORAL | Status: DC

## 2011-05-10 NOTE — Progress Notes (Signed)
CARE MANAGEMENT NOTE 05/10/2011  Patient:  [name], [name]   Account Number:  [id number]  Date Initiated:  05/10/2011  Documentation initiated by:  Colleen Can  Subjective/Objective Assessment:   dx patella fracture; ORIF     Action/Plan:   Plans home upon discharge with fsmily support. Already has standard walker.  NO hh pt recommendations or orders   Anticipated DC Date:  05/10/2011   Anticipated DC Plan:  HOME/SELF CARE  In-house referral  Financial Counselor      DC Planning Services  NA      [facility] Choice  NA   Choice offered to / List presented to:  NA   DME arranged  NA      DME agency  NA     HH arranged  NA      HH agency  NA   Status of service:  Completed, signed off Medicare Important Message given?  NO

## 2011-05-10 NOTE — Progress Notes (Signed)
Physical Therapy Treatment Patient Details Name: [name] MRN: [id number] DOB: [birth year] Today's Date: 05/10/2011  12:30-12:45 1 gt  PT Assessment/Plan  PT - Assessment/Plan Comments on Treatment Session: Pt and spouse educated through demonstration and practice going up backwards/down forwards on 4 stairs without rails using SW . Hand out given to pt.  Emphasized safety of using assistance every time with SW on stairs. Pt. tolerated session well.  Pt. and husband stated they had SW at home for pt use. Demonstrated use of belt to safely raise/lower RLE.  Explained to both pt and husband that brace was to remain on, no ROM in knee, and educated them on showering without getting leg wet. Nurse notified  that pt. was ready for  d/c PT Goals  Acute Rehab PT Goals PT Goal Formulation: With patient Pt will go Sit to Stand: with supervision PT Goal: Sit to Stand - Progress: Progressing toward goal Pt will go Stand to Sit: with supervision PT Goal: Stand to Sit - Progress: Progressing toward goal Pt will Ambulate: 16 - 50 feet;with supervision;with standard walker PT Goal: Ambulate - Progress: Progressing toward goal Pt will Go Up / Down Stairs: 3-5 stairs;with min assist;with least restrictive assistive device PT Goal: Up/Down Stairs - Progress: Progressing toward goal  PT Treatment Precautions/Restrictions  Precautions Precautions: Knee Precaution Comments: no ROM to R knee Required Braces or Orthoses: Yes Other Brace/Splint: hinged brace locked in 0 extension/flexion at all times Restrictions Weight Bearing Restrictions: No Mobility (including Balance) Bed Mobility Bed Mobility: Yes (Assisted pt OOB) Supine to Sit: 5: Supervision Supine to Sit Details (indicate cue type and reason): Demonstrated and instructed pt how to use a belt that is looped to bottom of her hinge brace tio assisted self with repositioning and raisinf of R LE to get in/OOB easier. Transfers Transfers:  Yes Sit to Stand: 5: Supervision;From bed Sit to Stand Details (indicate cue type and reason): 25% VC's on proper tech of hand placement and safety. Stand to Sit: To chair/3-in-1;5: Supervision Stand to Sit Details: 25% VC's on proper tech of hand placement and safety. Ambulation/Gait Ambulation/Gait: Yes Ambulation/Gait Assistance: Other (comment) (MinGuard Assist) Ambulation/Gait Assistance Details (indicate cue type and reason): 25% VC's on proper sequencing, proper walker to self placement, proper R LE placement and safety with turns and backward gait. Ambulation Distance (Feet): 70 Feet Assistive device: Standard walker Gait Pattern: Step-to pattern;Decreased stance time - right;Trunk flexed Gait velocity: Pt c/o 3/10 R knee pain with activity, applied ICE and repositioned. Stairs: No Stairs Assistance: 4: Min assist Stairs Assistance Details (indicate cue type and reason): 4 stairs, no rails, +1 assistance needed for safety.  Pt. and husband educated on going up stairs backward/down forward with SW Stair Management Technique: No rails;Backwards;Forwards;With walker;Step to pattern Number of Stairs: 4  Wheelchair Mobility Wheelchair Mobility: No    Exercise    End of Session PT - End of Session Equipment Utilized During Treatment: Gait belt Activity Tolerance: Patient tolerated treatment well Patient left: in chair;with call bell in reach;with family/visitor present Nurse Communication:  (let nurse know pt./husband educated on stairs, ready for d/c) General Behavior During Session: [facility] for tasks performed Cognition: [facility] for tasks performed  Hiram Comber, SPTA 05/10/2011, 2:34 PM  Felecia Shelling  PTA [facility]  Acute  Rehab Pager     [phone]

## 2011-05-10 NOTE — Progress Notes (Signed)
Physical Therapy Treatment Patient Details Name: [name] MRN: [id number] DOB: Feb 24, 1950 Today's Date: 05/10/2011  11:20 - 11:55 1 gt  1 ta   PT Assessment/Plan  PT - Assessment/Plan Comments on Treatment Session: Pt eager to D/C to home with spouse today.  Will perform stair training with spouse when he arrives.  Assisted pt OOB and instructed her on how to use a belt that is looped on the most distal strap of her hinge brace to assisted moving her R LE on/off bed.  Pt requested tno pain meds prior as she felt they made her naused yesterday.  Instructed pt on hinge brace, Don/Doff and proper placement.  Instructed pt on NO knee ROM.  PT Plan: Discharge plan remains appropriate Follow Up Recommendations: Home health PT PT Goals  Acute Rehab PT Goals PT Goal Formulation: With patient Pt will go Supine/Side to Sit: Independently PT Goal: Supine/Side to Sit - Progress: Progressing toward goal Pt will go Sit to Supine/Side: Independently PT Goal: Sit to Supine/Side - Progress: Progressing toward goal Pt will go Sit to Stand: with supervision PT Goal: Sit to Stand - Progress: Progressing toward goal Pt will go Stand to Sit: with supervision PT Goal: Stand to Sit - Progress: Progressing toward goal Pt will Ambulate: 16 - 50 feet;with supervision;with standard walker PT Goal: Ambulate - Progress: Progressing toward goal Pt will Go Up / Down Stairs: 3-5 stairs;with min assist;with least restrictive assistive device PT Goal: Up/Down Stairs - Progress: Progressing toward goal  PT Treatment Precautions/Restrictions  Precautions Precautions: Knee Precaution Comments: no ROM to R knee Required Braces or Orthoses: Yes Other Brace/Splint: hinged brace locked in 0 extension/flexion at all times Restrictions Weight Bearing Restrictions: No Mobility (including Balance) Bed Mobility Bed Mobility: Yes (Assisted pt OOB) Supine to Sit: 5: Supervision Supine to Sit Details (indicate cue  type and reason): Demonstrated and instructed pt how to use a belt that is looped to bottom of her hinge brace tio assisted self with repositioning and raisinf of R LE to get in/OOB easier. Transfers Transfers: Yes Sit to Stand: 5: Supervision;From bed Sit to Stand Details (indicate cue type and reason): 25% VC's on proper tech of hand placement and safety. Stand to Sit: To chair/3-in-1;5: Supervision Stand to Sit Details: 25% VC's on proper tech of hand placement and safety. Ambulation/Gait Ambulation/Gait: Yes Ambulation/Gait Assistance: Other (comment) (MinGuard Assist) Ambulation/Gait Assistance Details (indicate cue type and reason): 25% VC's on proper sequencing, proper walker to self placement, proper R LE placement and safety with turns and backward gait. Ambulation Distance (Feet): 70 Feet Assistive device: Standard walker Gait Pattern: Step-to pattern;Decreased stance time - right;Trunk flexed Gait velocity: Pt c/o 3/10 R knee pain with activity, applied ICE and repositioned. Stairs: No Stairs Assistance: 4: Min assist Stairs Assistance Details (indicate cue type and reason): 4 stairs, no rails, +1 assistance needed for safety.  Pt. and husband educated on going up stairs backward/down forward with SW Stair Management Technique: No rails;Backwards;Forwards;With walker;Step to pattern Number of Stairs: 4  Wheelchair Mobility Wheelchair Mobility: No    Exercise    End of Session PT - End of Session Equipment Utilized During Treatment: Gait belt;Other (comment) (R hinge brace) Activity Tolerance: Patient tolerated treatment well;Other (comment) (no c/o nausea, just increased pain as end of session) Patient left: in chair;with call bell in reach;with family/visitor present Nurse Communication: Other (comment) (pain meds) General Behavior During Session: [facility] for tasks performed Cognition: [facility] for tasks performed  Felecia Shelling  PTA [facility]  Acute  Rehab Pager     [phone]

## 2011-05-10 NOTE — Discharge Instructions (Signed)
  Toni Arthurs, MD [facility]  Please read the following information regarding your care after surgery.  Medications  You only need a prescription for the narcotic pain medicine (ex. oxycodone, Percocet, Norco).  All of the other medicines listed below are available over the counter. ? acetominophen (Tylenol) 650 mg every 4-6 hours as you need for minor pain ? oxycodone as prescribed for moderate to severe pain X tramadol as prescribed for pain   Narcotic pain medicine (ex. oxycodone, Percocet, Vicodin) will cause constipation.  To prevent this problem, take the following medicines while you are taking any pain medicine. X docusate sodium (Colace) 100 mg twice a day X senna (Senokot) 2 tablets twice a day  X To help prevent blood clots, takeXarelto as prescribed for two weeks after surgery.  You should also get up every hour while you are awake to move around.    Weight Bearing X Bear weight when you are able on your operated leg or foot with the knee brace on. ? Bear weight only on the heel of your operated foot in the post-op shoe. ? Do not bear any weight on the operated leg or foot.  Cast / Splint / Dressing X Keep your splint or cast clean and dry.  Don't put anything (coat hanger, pencil, etc) down inside of it.  If it gets damp, use a hair dryer on the cool setting to dry it.  If it gets soaked, call the office to schedule an appointment for a cast change. ? Remove your dressing 3 days after surgery and cover the incisions with dry dressings.    After your dressing, cast or splint is removed; you may shower, but do not soak or scrub the wound.  Allow the water to run over it, and then gently pat it dry.  Swelling It is normal for you to have swelling where you had surgery.  To reduce swelling and pain, keep your toes above your nose for at least 3 days after surgery.  It may be necessary to keep your foot or leg elevated for several weeks.  If it hurts, it should be  elevated.  Follow Up Call my office at [phone] when you are discharged from the hospital or surgery center to schedule an appointment to be seen two weeks after surgery.  Call my office at [phone] if you develop a fever >101.5 F, nausea, vomiting, bleeding from the surgical site or severe pain.

## 2011-05-10 NOTE — Discharge Summary (Signed)
Physician Discharge Summary  Patient ID: [name] MRN: [id number] DOB/AGE: Jun 20, 1950 61 y.o.  Admit date: 05/08/2011 Discharge date: 05/10/2011  Admission Diagnoses:  Right patella fracture  Discharge Diagnoses: Right patella fracture   Discharged Condition: stable  Hospital Course: Pt was admitted on 4/9 and taken to the or for orif of right patella fracture.  She tolerated the procedure well and was taken to the ortho ward where she remained until discharge.  She did well withPT after her post op N/V resolved.  She was discharged to home on 4/11 in stable condition.  Consults: PT  Significant Diagnostic Studies: xrays  Treatments: surgery: ORIF patella  Discharge Exam: Blood pressure 115/64, pulse 108, temperature 99.6 F (37.6 C), temperature source Oral, resp. rate 16, height 5\' 3"  (1.6 m), weight 61.689 kg (136 lb), SpO2 94.00%. right knee dressed and dry.  knee ROM brace in place.  Disposition: to home  Discharge Orders    Future Orders Please Complete By Expires   Diet - low sodium heart healthy      Call MD / Call 911      Comments:   If you experience chest pain or shortness of breath, CALL 911 and be transported to the hospital emergency room.  If you develope a fever above 101 F, pus (white drainage) or increased drainage or redness at the wound, or calf pain, call your surgeon's office.   Constipation Prevention      Comments:   Drink plenty of fluids.  Prune juice may be helpful.  You may use a stool softener, such as Colace (over the counter) 100 mg twice a day.  Use MiraLax (over the counter) for constipation as needed.   Increase activity slowly as tolerated      Weight Bearing as taught in Physical Therapy      Comments:   Use a walker or crutches as instructed.     Medication List  As of 05/10/2011 12:54 PM   TAKE these medications         HYDROcodone-acetaminophen 5-325 MG per tablet   Commonly known as: NORCO   Take 1-2 tablets by mouth  every 6 (six) hours as needed for pain.      rivaroxaban 10 MG Tabs tablet   Commonly known as: XARELTO   Take 1 tablet (10 mg total) by mouth daily.      traMADol 50 MG tablet   Commonly known as: ULTRAM   Take 1-2 tablets (50-100 mg total) by mouth every 8 (eight) hours as needed.           Follow-up Information    Follow up with [facility], [place] .       Dr. Victorino Dike in two weeks.  Call [phone] for an appointment.  SignedToni Arthurs 05/10/2011, 12:54 PM

## 2011-05-23 ENCOUNTER — Encounter (HOSPITAL_COMMUNITY): Payer: Self-pay | Admitting: Orthopedic Surgery

## 2012-10-24 ENCOUNTER — Ambulatory Visit (HOSPITAL_COMMUNITY)
Admission: RE | Admit: 2012-10-24 | Discharge: 2012-10-24 | Disposition: A | Discharge status: Home / Self Care | Payer: Self-pay | Source: Ambulatory Visit | Attending: Family Medicine | Admitting: Family Medicine

## 2012-10-24 ENCOUNTER — Other Ambulatory Visit (HOSPITAL_COMMUNITY): Payer: Self-pay | Admitting: Family Medicine

## 2012-10-24 DIAGNOSIS — M25552 Pain in left hip: Secondary | ICD-10-CM

## 2012-10-24 DIAGNOSIS — M25559 Pain in unspecified hip: Secondary | ICD-10-CM | POA: Insufficient documentation

## 2012-10-24 DIAGNOSIS — M25569 Pain in unspecified knee: Secondary | ICD-10-CM | POA: Insufficient documentation

## 2014-07-17 ENCOUNTER — Emergency Department (HOSPITAL_COMMUNITY): Payer: Self-pay

## 2014-07-17 ENCOUNTER — Encounter (HOSPITAL_COMMUNITY): Payer: Self-pay | Admitting: Emergency Medicine

## 2014-07-17 ENCOUNTER — Emergency Department (HOSPITAL_COMMUNITY)
Admission: EM | Admit: 2014-07-17 | Discharge: 2014-07-17 | Disposition: A | Discharge status: Home / Self Care | Payer: Self-pay | Attending: Emergency Medicine | Admitting: Emergency Medicine

## 2014-07-17 DIAGNOSIS — G8929 Other chronic pain: Secondary | ICD-10-CM | POA: Insufficient documentation

## 2014-07-17 DIAGNOSIS — M79605 Pain in left leg: Secondary | ICD-10-CM | POA: Insufficient documentation

## 2014-07-17 DIAGNOSIS — Z87891 Personal history of nicotine dependence: Secondary | ICD-10-CM | POA: Insufficient documentation

## 2014-07-17 DIAGNOSIS — M79604 Pain in right leg: Secondary | ICD-10-CM | POA: Insufficient documentation

## 2014-07-17 DIAGNOSIS — R110 Nausea: Secondary | ICD-10-CM | POA: Insufficient documentation

## 2014-07-17 DIAGNOSIS — M545 Low back pain: Secondary | ICD-10-CM | POA: Insufficient documentation

## 2014-07-17 DIAGNOSIS — Z8701 Personal history of pneumonia (recurrent): Secondary | ICD-10-CM | POA: Insufficient documentation

## 2014-07-17 DIAGNOSIS — R109 Unspecified abdominal pain: Secondary | ICD-10-CM | POA: Insufficient documentation

## 2014-07-17 LAB — URINALYSIS, ROUTINE W REFLEX MICROSCOPIC
Bilirubin Urine: NEGATIVE
GLUCOSE, UA: NEGATIVE mg/dL
HGB URINE DIPSTICK: NEGATIVE
Ketones, ur: NEGATIVE mg/dL
Leukocytes, UA: NEGATIVE
Nitrite: NEGATIVE
PROTEIN: NEGATIVE mg/dL
Specific Gravity, Urine: 1.025 (ref 1.005–1.030)
Urobilinogen, UA: 0.2 mg/dL (ref 0.0–1.0)
pH: 5.5 (ref 5.0–8.0)

## 2014-07-17 MED ORDER — ONDANSETRON 4 MG PO TBDP
ORAL_TABLET | ORAL | Status: AC
  Filled 2014-07-17: qty 1

## 2014-07-17 MED ORDER — OXYCODONE-ACETAMINOPHEN 5-325 MG PO TABS
1.0000 | ORAL_TABLET | Freq: Once | ORAL | Status: AC
  Administered 2014-07-17: 1 via ORAL
  Filled 2014-07-17: qty 1

## 2014-07-17 NOTE — ED Notes (Addendum)
Patient complaining of lower back pain "for years" worsening since yesterday. States pain radiates down bilateral legs. Patient ambulatory at triage. Denies injury.

## 2014-07-17 NOTE — ED Notes (Signed)
Patient complaining of nausea. Offered zofran ODT, patient refused medication. States "I'm not going to throw up. I don't want that."

## 2014-07-17 NOTE — ED Provider Notes (Signed)
CSN: [id number]     Arrival date & time 07/17/14  0020 History  This chart was scribed for Zadie Rhine, MD by Tanda Rockers, ED Scribe. This patient was seen in room APA07/APA07 and the patient's care was started at 12:55 AM.    Chief Complaint  Patient presents with  . Back Pain   Patient is a 64 y.o. female presenting with back pain. The history is provided by the patient. No language interpreter was used.  Back Pain Location:  Lumbar spine Quality:  Unable to specify Radiates to: Bilateral legs. Duration:  2 days Timing:  Constant Progression:  Worsening Chronicity:  Chronic Associated symptoms: no numbness and no weakness      HPI Comments: [name] is a 64 y.o. female who presents to the Emergency Department complaining of lower back pain for "years", worsening for the past 2 days. Pt states that the pain radiates down her bilateral legs. Denies previous back surgeries. Pt also complains of abdominal pain and nausea. No recent injury, trauma, or fall. Denies vomiting, numbness or weakness in extremities, urinary or bowel incontinence, or any other associated symptoms.    Past Medical History  Diagnosis Date  . Pneumonia approx. 2006   Past Surgical History  Procedure Laterality Date  . Tubal ligation  1985  . Orif patella  05/08/2011    Procedure: OPEN REDUCTION INTERNAL (ORIF) FIXATION PATELLA;  Surgeon: Toni Arthurs, MD;  Location: [facility];  Service: Orthopedics;  Laterality: Right;   History reviewed. No pertinent family history. History  Substance Use Topics  . Smoking status: Former Smoker -- 1.00 packs/day for 40 years    Types: Cigarettes    Quit date: 01/27/2011  . Smokeless tobacco: Never Used  . Alcohol Use: No   OB History    No data available     Review of Systems  Musculoskeletal: Positive for back pain and arthralgias (Bilateral lower extremities).  Neurological: Negative for weakness and numbness.      Allergies  Aspirin  Home  Medications   Prior to Admission medications   Not on File   Triage Vitals: BP 145/69 mmHg  Pulse 80  Temp(Src) 98.1 F (36.7 C) (Oral)  Resp 16  Ht  (1.6 m)  Wt 145 lb (65.772 kg)  BMI 25.69 kg/m2  SpO2 100%   Physical Exam  Nursing note and vitals reviewed. CONSTITUTIONAL: Well developed/well nourished HEAD: Normocephalic/atraumatic EYES: EOMI/PERRL ENMT: Mucous membranes moist NECK: supple no meningeal signs SPINE/BACK:Lumbar spinal tenderness No bruising/crepitance/stepoffs noted to spine CV: S1/S2 noted, no murmurs/rubs/gallops noted LUNGS: Lungs are clear to auscultation bilaterally, no apparent distress ABDOMEN: soft, nontender, no rebound or guarding GU:no cva tenderness NEURO: Awake/alert, equal motor 5/5 strength noted with the following: hip flexion/knee flexion/extension, foot dorsi/plantar flexion, great toe extension intact bilaterally, no clonus bilaterally, plantar reflex appropriate (toes downgoing), no sensory deficit in any dermatome.  Equal patellar/achilles reflex noted (2+) in bilateral lower extremities.  Pt is able to ambulate unassisted. EXTREMITIES: pulses normal, full ROM SKIN: warm, color normal PSYCH: no abnormalities of mood noted, alert and oriented to situation    ED Course  Procedures (including critical care time)  DIAGNOSTIC STUDIES: Oxygen Saturation is 100% on RA, normal by my interpretation.    COORDINATION OF CARE: 12:58 AM-Discussed treatment plan which includes DG L Spine, UA, and pain medication with pt at bedside and pt agreed to plan.   Labs Review Labs Reviewed  URINALYSIS, ROUTINE W REFLEX MICROSCOPIC (NOT AT [facility])  Imaging Review Dg Lumbar Spine Complete  07/17/2014   CLINICAL DATA:  Increasing mid low back pain for 2 days.  No injury.  EXAM: LUMBAR SPINE - COMPLETE 4+ VIEW  COMPARISON:  None.  FINDINGS: Degenerative changes in the lumbar spine. Endplate hypertrophic changes at multiple levels. Normal alignment.  No vertebral compression deformities. No focal bone lesion or bone destruction. Bone cortex and trabecular architecture appear intact. Vascular calcifications.  IMPRESSION: Degenerative changes in the lumbar spine. Normal alignment. No acute bony abnormalities.   Electronically Signed   By: Burman Nieves M.D.   On: 07/17/2014 01:30   No focal abd tenderness No vomiting (she had mild nausea after pain meds) She has no neuro deficits She can ambulate I feel most of her issues stem from back pain but no deficits, stable for outpatient management Discussed strict return precautions Referred to spine Advised to continue short course of ibuprofen at home Advised if abd pain worsens with vomiting over next 12 hrs she must return to eR  Medications  oxyCODONE-acetaminophen (PERCOCET/ROXICET) 5-325 MG per tablet 1 tablet (1 tablet Oral Given 07/17/14 0105)     MDM   Final diagnoses:  Low back pain, unspecified back pain laterality, with sciatica presence unspecified    Nursing notes including past medical history and social history reviewed and considered in documentation xrays/imaging reviewed by myself and considered during evaluation Labs/vital reviewed myself and considered during evaluation   I personally performed the services described in this documentation, which was scribed in my presence. The recorded information has been reviewed and is accurate.      Zadie Rhine, MD 07/17/14 [phone]

## 2014-07-17 NOTE — Discharge Instructions (Signed)

## 2014-12-28 DIAGNOSIS — B353 Tinea pedis: Secondary | ICD-10-CM | POA: Insufficient documentation

## 2014-12-28 DIAGNOSIS — L608 Other nail disorders: Secondary | ICD-10-CM | POA: Insufficient documentation

## 2014-12-28 DIAGNOSIS — H6690 Otitis media, unspecified, unspecified ear: Secondary | ICD-10-CM | POA: Insufficient documentation

## 2014-12-29 DIAGNOSIS — Z0000 Encounter for general adult medical examination without abnormal findings: Secondary | ICD-10-CM | POA: Insufficient documentation

## 2015-03-20 DIAGNOSIS — D649 Anemia, unspecified: Secondary | ICD-10-CM | POA: Insufficient documentation

## 2015-03-20 DIAGNOSIS — R5381 Other malaise: Secondary | ICD-10-CM | POA: Insufficient documentation

## 2016-01-15 DIAGNOSIS — L218 Other seborrheic dermatitis: Secondary | ICD-10-CM | POA: Insufficient documentation

## 2016-01-15 DIAGNOSIS — H66009 Acute suppurative otitis media without spontaneous rupture of ear drum, unspecified ear: Secondary | ICD-10-CM | POA: Insufficient documentation

## 2016-07-09 DIAGNOSIS — K219 Gastro-esophageal reflux disease without esophagitis: Secondary | ICD-10-CM | POA: Insufficient documentation

## 2016-07-09 DIAGNOSIS — R1013 Epigastric pain: Secondary | ICD-10-CM | POA: Insufficient documentation

## 2016-07-17 DIAGNOSIS — K828 Other specified diseases of gallbladder: Secondary | ICD-10-CM | POA: Insufficient documentation

## 2016-08-05 HISTORY — PX: CHOLECYSTECTOMY: SHX55

## 2016-09-12 DIAGNOSIS — L259 Unspecified contact dermatitis, unspecified cause: Secondary | ICD-10-CM | POA: Insufficient documentation

## 2017-01-09 IMAGING — DX DG LUMBAR SPINE COMPLETE 4+V
5 series · 5 of 5 positions shown · non-contrast
Comparison: None.

CLINICAL DATA: Increasing mid low back pain for 2 days.  No injury.

EXAM:
LUMBAR SPINE - COMPLETE 4+ VIEW

[l-spine ap]
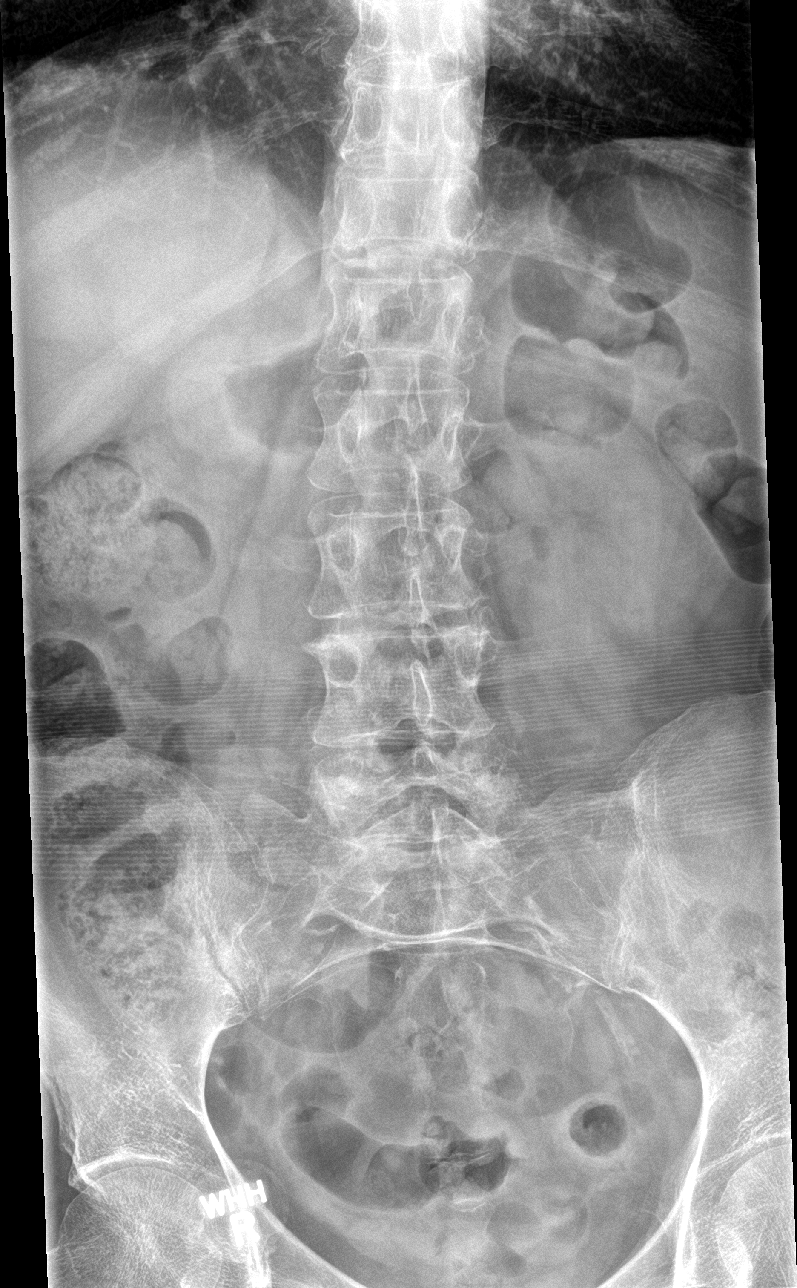

[l-spine obl (1 of 2)]
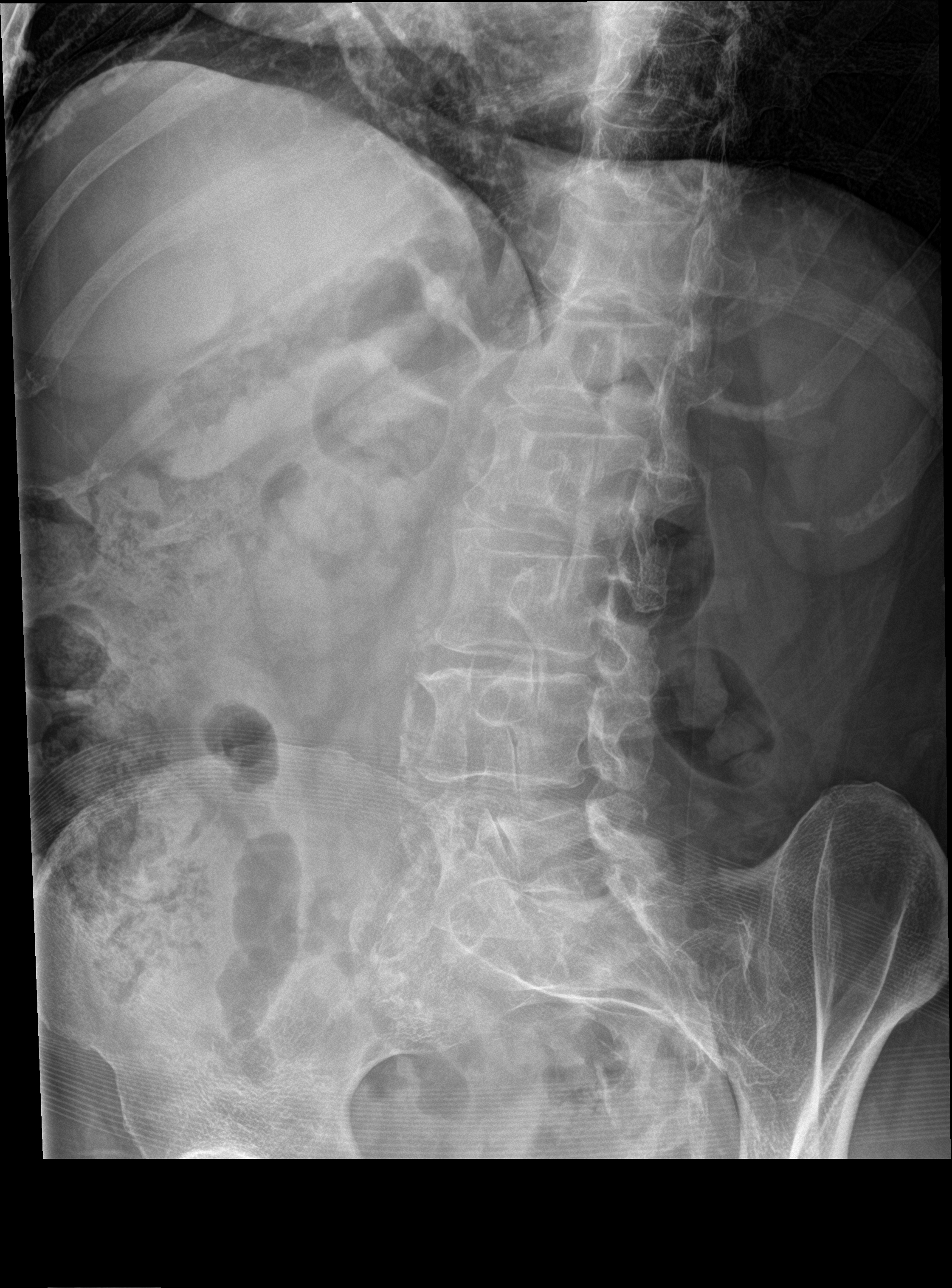

[l-spine obl (2 of 2)]
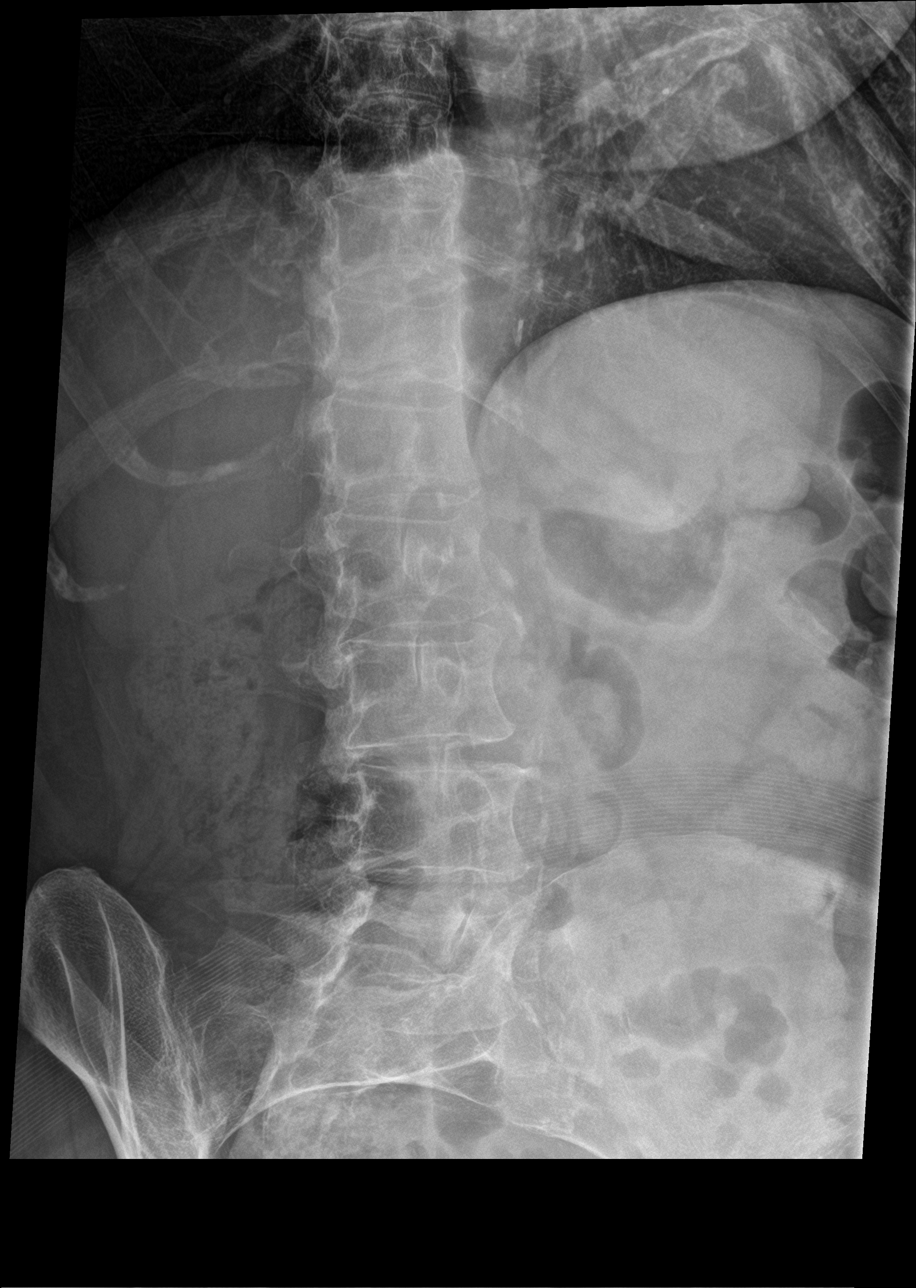

[l-spine lat]
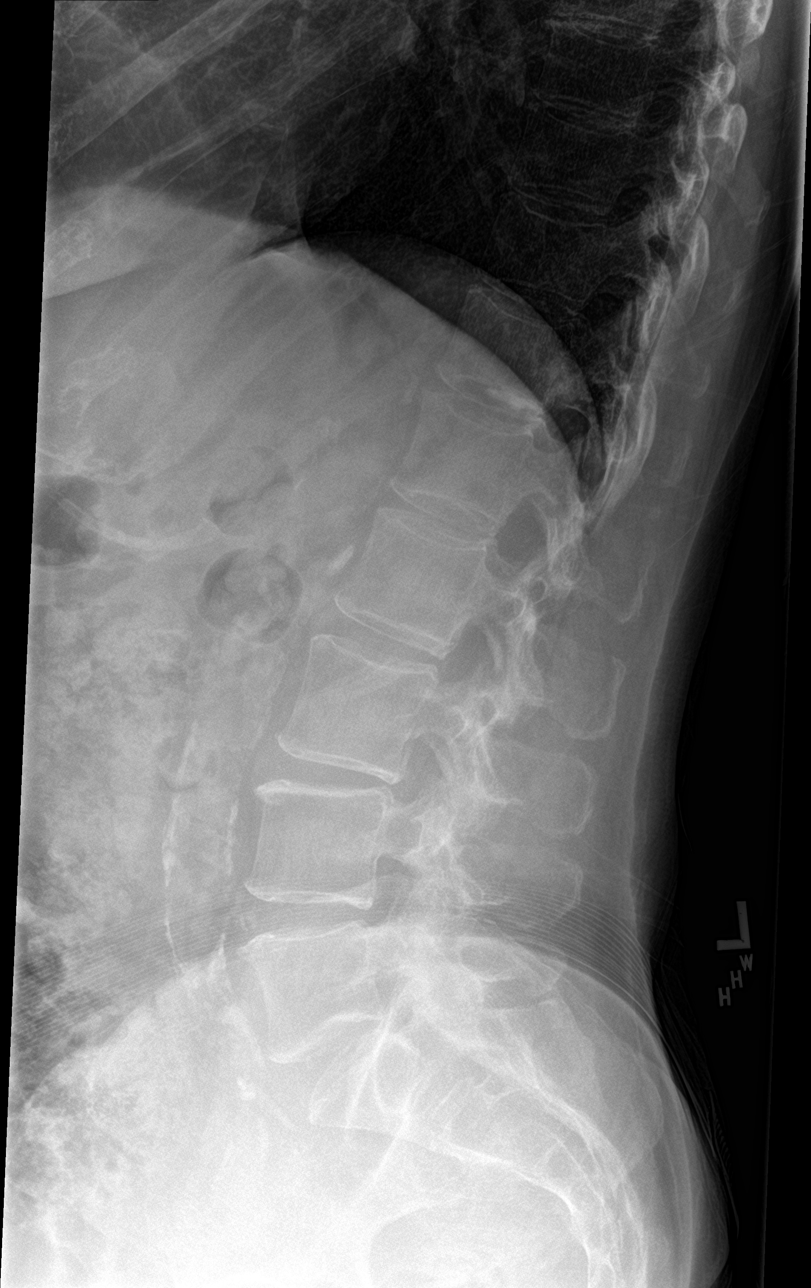

[l-spine spot]
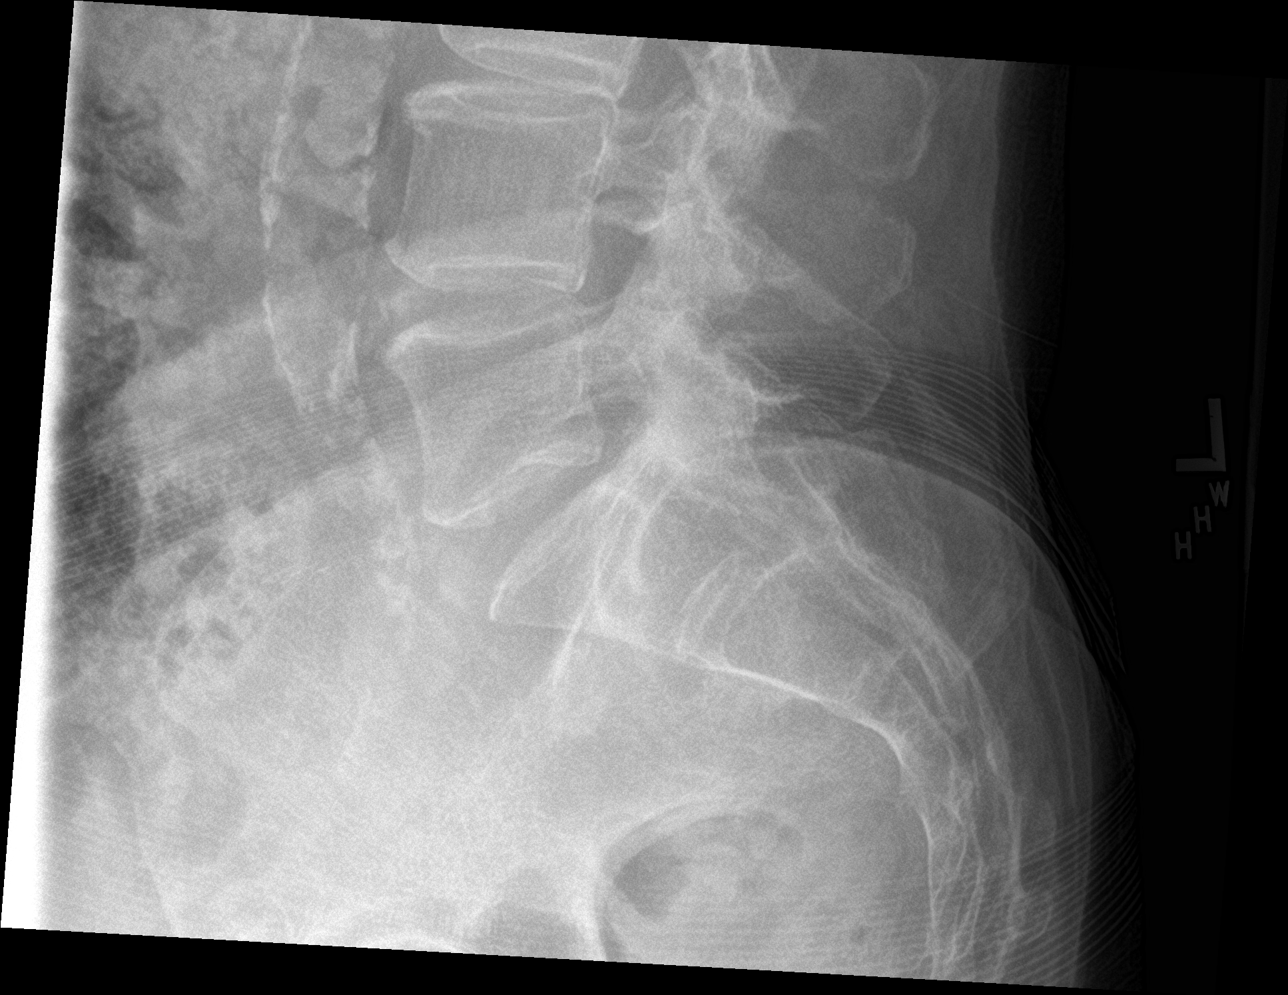

[5 of 5 positions shown; findings below may reference images not displayed]

FINDINGS: Degenerative changes in the lumbar spine. Endplate hypertrophic
changes at multiple levels. Normal alignment. No vertebral
compression deformities. No focal bone lesion or bone destruction.
Bone cortex and trabecular architecture appear intact. Vascular
calcifications.
IMPRESSION: Degenerative changes in the lumbar spine. Normal alignment. No acute
bony abnormalities.

## 2017-03-22 DIAGNOSIS — R197 Diarrhea, unspecified: Secondary | ICD-10-CM | POA: Insufficient documentation

## 2017-03-25 DIAGNOSIS — A045 Campylobacter enteritis: Secondary | ICD-10-CM | POA: Insufficient documentation

## 2017-11-04 ENCOUNTER — Encounter: Payer: Self-pay | Admitting: Pulmonary Disease

## 2017-11-07 DIAGNOSIS — K449 Diaphragmatic hernia without obstruction or gangrene: Secondary | ICD-10-CM | POA: Insufficient documentation

## 2017-11-07 DIAGNOSIS — J4489 Other specified chronic obstructive pulmonary disease: Secondary | ICD-10-CM | POA: Insufficient documentation

## 2017-11-13 ENCOUNTER — Encounter: Payer: Self-pay | Admitting: Gastroenterology

## 2017-12-04 DIAGNOSIS — M25559 Pain in unspecified hip: Secondary | ICD-10-CM | POA: Insufficient documentation

## 2017-12-04 DIAGNOSIS — I2510 Atherosclerotic heart disease of native coronary artery without angina pectoris: Secondary | ICD-10-CM | POA: Insufficient documentation

## 2018-02-19 ENCOUNTER — Ambulatory Visit: Payer: Self-pay | Admitting: Gastroenterology

## 2018-07-08 DIAGNOSIS — M79609 Pain in unspecified limb: Secondary | ICD-10-CM | POA: Insufficient documentation

## 2018-07-08 DIAGNOSIS — M722 Plantar fascial fibromatosis: Secondary | ICD-10-CM | POA: Insufficient documentation

## 2018-08-27 DIAGNOSIS — R209 Unspecified disturbances of skin sensation: Secondary | ICD-10-CM | POA: Insufficient documentation

## 2018-10-24 ENCOUNTER — Encounter: Payer: Self-pay | Admitting: *Deleted

## 2018-10-27 ENCOUNTER — Ambulatory Visit (INDEPENDENT_AMBULATORY_CARE_PROVIDER_SITE_OTHER): Payer: Medicare Other | Admitting: Cardiovascular Disease

## 2018-10-27 ENCOUNTER — Other Ambulatory Visit: Payer: Self-pay

## 2018-10-27 ENCOUNTER — Encounter: Payer: Self-pay | Admitting: Cardiovascular Disease

## 2018-10-27 ENCOUNTER — Encounter: Payer: Self-pay | Admitting: *Deleted

## 2018-10-27 ENCOUNTER — Telehealth: Payer: Self-pay | Admitting: Cardiovascular Disease

## 2018-10-27 VITALS — BP 125/73 | HR 94 | Ht 63.0 in | Wt 169.0 lb

## 2018-10-27 DIAGNOSIS — R0789 Other chest pain: Secondary | ICD-10-CM

## 2018-10-27 DIAGNOSIS — I739 Peripheral vascular disease, unspecified: Secondary | ICD-10-CM

## 2018-10-27 DIAGNOSIS — I779 Disorder of arteries and arterioles, unspecified: Secondary | ICD-10-CM

## 2018-10-27 DIAGNOSIS — R079 Chest pain, unspecified: Secondary | ICD-10-CM | POA: Diagnosis not present

## 2018-10-27 NOTE — Patient Instructions (Signed)
Medication Instructions:  Continue all current medications.  Labwork: none  Testing/Procedures:  Your physician has requested that you have a carotid duplex. This test is an ultrasound of the carotid arteries in your neck. It looks at blood flow through these arteries that supply the brain with blood. Allow one hour for this exam. There are no restrictions or special instructions.  Your physician has requested that you have a lexiscan myoview. For further information please visit [email or web address]. Please follow instruction sheet, as given.  Your physician has requested that you have an echocardiogram. Echocardiography is a painless test that uses sound waves to create images of your heart. It provides your doctor with information about the size and shape of your heart and how well your heart's chambers and valves are working. This procedure takes approximately one hour. There are no restrictions for this procedure.  Office will contact with results via phone or letter.    Follow-Up: 8-10 weeks   Any Other Special Instructions Will Be Listed Below (If Applicable).  If you need a refill on your cardiac medications before your next appointment, please call your pharmacy.

## 2018-10-27 NOTE — Progress Notes (Signed)
CARDIOLOGY CONSULT NOTE  Patient ID: Alexandra KoyanagiDeborah P Hornbrook MRN: [id number] DOB/AGE: [birth year] 68 y.o.  Admit date: (Not on file) Primary Physician: Juliette AlcideBurdine, Steven E, MD  Reason for Consultation: Chest pain  HPI: [name] is a 68 y.o. female who is being seen today for the evaluation of chest pain at the request of Burdine, Ananias PilgrimSteven E, MD.   She has been having chest pain for the past 6 to 7 months which primarily occurs at night.  It has occasionally woken her from sleep.  She was prescribed nitroglycerin by her PCP.  She has not had to use it frequently but when she does it does alleviate her symptoms.  She has never had to use more than 1 nitroglycerin tablet at a time.  She says she has a hiatal hernia.  She denies abdominal pain, nausea, and vomiting.  She denies dysphagia for solids or liquids.  She has chronic exertional dyspnea which she attributes to COPD which she says is stable.  She has bilateral hip arthritic pain and feet pain.  Palpitations have decreased in frequency over the past several months.  She denies leg swelling, orthopnea, and paroxysmal nocturnal dyspnea.  She does not exercise.  She primarily watches TV and likes to cook and eat.  She told me she was diagnosed with carotid artery disease several years ago.  I reviewed records from her PCP.  Labs 10/02/2018: BUN 21, creatinine 1, sodium 141, potassium 4.1, normal liver transaminases, white blood cells 9.3, hemoglobin 11.2, platelets 218, total cholesterol 237, triglycerides 269, HDL 41, LDL 147, TSH 7.  ECG performed in the office today which I ordered and personally interpreted demonstrates sinus tachycardia with PVCs, 102 bpm, with diffuse nonspecific ST segment depressions.   Family history: Father died of an MI in his 9070s.  He had diabetes.  Social history: She and her husband are separated.  She lives with her son.  She used to work for [employer].   Allergies  Allergen Reactions   . Aspirin   . Atorvastatin Other (See Comments)    Muscle pain  . Crestor [Rosuvastatin Calcium] Other (See Comments)    Muscle pain    Current Outpatient Medications  Medication Sig Dispense Refill  . acetaminophen (TYLENOL) 650 MG CR tablet Take 650 mg by mouth every 8 (eight) hours as needed for pain.    Marland Kitchen. albuterol (VENTOLIN HFA) 108 (90 Base) MCG/ACT inhaler Inhale 1-2 puffs into the lungs every 4 (four) hours as needed.    Ailene Ards. ANORO ELLIPTA 62.5-25 MCG/INH AEPB Inhale 1 puff into the lungs daily.    . cetirizine (ZYRTEC) 10 MG tablet Take 10 mg by mouth 2 (two) times daily.     Marland Kitchen. levothyroxine (SYNTHROID) 25 MCG tablet Take 25 mcg by mouth every morning.    . Multiple Vitamin (MULTIVITAMIN) tablet Take 1 tablet by mouth daily.    . nitroGLYCERIN (NITROSTAT) 0.4 MG SL tablet Place 1 tablet under the tongue every 5 (five) minutes x 3 doses as needed for chest pain.    Marland Kitchen. omeprazole (PRILOSEC) 40 MG capsule Take 1 capsule by mouth daily.     No current facility-administered medications for this visit.     Past Medical History:  Diagnosis Date  . Pneumonia approx. 2006    Past Surgical History:  Procedure Laterality Date  . CHOLECYSTECTOMY  08/05/2016  . ORIF PATELLA  05/08/2011   Procedure: OPEN REDUCTION INTERNAL (ORIF) FIXATION PATELLA;  Surgeon: Toni ArthursJohn Hewitt,  MD;  Location: [facility];  Service: Orthopedics;  Laterality: Right;  . TUBAL LIGATION  1985    Social History   Socioeconomic History  . Marital status: Married    Spouse name: Not on file  . Number of children: Not on file  . Years of education: Not on file  . Highest education level: Not on file  Occupational History  . Not on file  Social Needs  . Financial resource strain: Not on file  . Food insecurity    Worry: Not on file    Inability: Not on file  . Transportation needs    Medical: Not on file    Non-medical: Not on file  Tobacco Use  . Smoking status: Former Smoker    Packs/day: 1.00    Years: 40.00     Pack years: 40.00    Types: Cigarettes    Quit date: 01/27/2011    Years since quitting: 7.7  . Smokeless tobacco: Never Used  Substance and Sexual Activity  . Alcohol use: No  . Drug use: No  . Sexual activity: Not on file  Lifestyle  . Physical activity    Days per week: Not on file    Minutes per session: Not on file  . Stress: Not on file  Relationships  . Social Herbalist on phone: Not on file    Gets together: Not on file    Attends religious service: Not on file    Active member of club or organization: Not on file    Attends meetings of clubs or organizations: Not on file    Relationship status: Not on file  . Intimate partner violence    Fear of current or ex partner: Not on file    Emotionally abused: Not on file    Physically abused: Not on file    Forced sexual activity: Not on file  Other Topics Concern  . Not on file  Social History Narrative  . Not on file      Current Meds  Medication Sig  . acetaminophen (TYLENOL) 650 MG CR tablet Take 650 mg by mouth every 8 (eight) hours as needed for pain.  Marland Kitchen albuterol (VENTOLIN HFA) 108 (90 Base) MCG/ACT inhaler Inhale 1-2 puffs into the lungs every 4 (four) hours as needed.  Jearl Klinefelter ELLIPTA 62.5-25 MCG/INH AEPB Inhale 1 puff into the lungs daily.  . cetirizine (ZYRTEC) 10 MG tablet Take 10 mg by mouth 2 (two) times daily.   Marland Kitchen levothyroxine (SYNTHROID) 25 MCG tablet Take 25 mcg by mouth every morning.  . Multiple Vitamin (MULTIVITAMIN) tablet Take 1 tablet by mouth daily.  . nitroGLYCERIN (NITROSTAT) 0.4 MG SL tablet Place 1 tablet under the tongue every 5 (five) minutes x 3 doses as needed for chest pain.  Marland Kitchen omeprazole (PRILOSEC) 40 MG capsule Take 1 capsule by mouth daily.      Review of systems complete and found to be negative unless listed above in HPI    Physical exam Blood pressure 125/73, pulse 94, height 5\' 3"  (1.6 m), weight 169 lb (76.7 kg), SpO2 98 %. General: NAD Neck: No JVD, no  thyromegaly or thyroid nodule.  Lungs: Clear to auscultation bilaterally with normal respiratory effort. CV: Nondisplaced PMI. Regular rate and rhythm, normal S1/S2, no S3/S4, no murmur.  No peripheral edema.  No carotid bruit.    Abdomen: Soft, nontender, no distention.  Skin: Intact without lesions or rashes.  Neurologic: Alert and oriented x 3.  Psych: Normal affect. Extremities: No clubbing or cyanosis.  HEENT: Normal.   ECG: Most recent ECG reviewed.   Labs: Lab Results  Component Value Date/Time   CREATININE 0.65 05/09/2011 05:06 PM     Lipids: No results found for: LDLCALC, LDLDIRECT, CHOL, TRIG, HDL      ASSESSMENT AND PLAN:  1.  Chest pain: Unclear etiology.  Typical and atypical symptoms.  It is relieved with nitroglycerin.  She has a prior history of tobacco use.  She is unable to walk on a treadmill due to bilateral hip arthritic pain and lower back pain. I will proceed with a nuclear myocardial perfusion imaging study to evaluate for ischemic heart disease (Lexiscan Myoview). I will order a 2-D echocardiogram with Doppler to evaluate cardiac structure, function, and regional wall motion.  2.  Bilateral carotid artery disease: I will obtain carotid Dopplers.    Disposition: Follow up in 8-10 weeks   Signed: Prentice Docker, M.D., F.A.C.C.  10/27/2018, 9:16 AM

## 2018-10-27 NOTE — Telephone Encounter (Signed)
Pre-cert Verification for the following procedure    Lexiscan scheduled for 10-31-2018 at [facility]

## 2018-10-31 ENCOUNTER — Encounter (HOSPITAL_COMMUNITY): Payer: Self-pay

## 2018-10-31 ENCOUNTER — Encounter (HOSPITAL_BASED_OUTPATIENT_CLINIC_OR_DEPARTMENT_OTHER)
Admission: RE | Admit: 2018-10-31 | Discharge: 2018-10-31 | Disposition: A | Discharge status: Home / Self Care | Payer: Medicare Other | Source: Ambulatory Visit | Attending: Cardiovascular Disease | Admitting: Cardiovascular Disease

## 2018-10-31 ENCOUNTER — Encounter (HOSPITAL_COMMUNITY)
Admission: RE | Admit: 2018-10-31 | Discharge: 2018-10-31 | Disposition: A | Discharge status: Home / Self Care | Payer: Medicare Other | Source: Ambulatory Visit | Attending: Cardiovascular Disease | Admitting: Cardiovascular Disease

## 2018-10-31 ENCOUNTER — Other Ambulatory Visit: Payer: Self-pay

## 2018-10-31 DIAGNOSIS — R0789 Other chest pain: Secondary | ICD-10-CM

## 2018-10-31 LAB — NM MYOCAR MULTI W/SPECT W/WALL MOTION / EF
LV dias vol: 50 mL (ref 46–106)
LV sys vol: 9 mL
Peak HR: 121 {beats}/min
RATE: 0.43
Rest HR: 91 {beats}/min
SDS: 1
SRS: 0
SSS: 1
TID: 1

## 2018-10-31 MED ORDER — REGADENOSON 0.4 MG/5ML IV SOLN
INTRAVENOUS | Status: AC
  Administered 2018-10-31: 12:00:00 0.4 mg via INTRAVENOUS
  Filled 2018-10-31: qty 5

## 2018-10-31 MED ORDER — TECHNETIUM TC 99M TETROFOSMIN IV KIT
10.0000 | PACK | Freq: Once | INTRAVENOUS | Status: AC | PRN
  Administered 2018-10-31: 10:00:00 10.2 via INTRAVENOUS

## 2018-10-31 MED ORDER — SODIUM CHLORIDE 0.9% FLUSH
INTRAVENOUS | Status: AC
  Administered 2018-10-31: 12:00:00 10 mL via INTRAVENOUS
  Filled 2018-10-31: qty 10

## 2018-10-31 MED ORDER — TECHNETIUM TC 99M TETROFOSMIN IV KIT
30.0000 | PACK | Freq: Once | INTRAVENOUS | Status: AC | PRN
  Administered 2018-10-31: 12:00:00 29.9 via INTRAVENOUS

## 2018-11-04 ENCOUNTER — Telehealth: Payer: Self-pay | Admitting: *Deleted

## 2018-11-04 NOTE — Telephone Encounter (Signed)
Notes recorded by Laurine Blazer, LPN on 41/06/6061 at 01:60 AM EDT  Patient notified. Copy to pmd. Follow up scheduled for November.   ------   Notes recorded by Herminio Commons, MD on 11/03/2018 at 9:03 AM EDT  Low risk for significant blockages. Cardiac function appears to be normal. I will discuss further at follow-up visit.

## 2018-11-26 ENCOUNTER — Ambulatory Visit (INDEPENDENT_AMBULATORY_CARE_PROVIDER_SITE_OTHER): Payer: Medicare Other

## 2018-11-26 ENCOUNTER — Other Ambulatory Visit: Payer: Self-pay | Admitting: Cardiovascular Disease

## 2018-11-26 ENCOUNTER — Other Ambulatory Visit: Payer: Self-pay

## 2018-11-26 DIAGNOSIS — I779 Disorder of arteries and arterioles, unspecified: Secondary | ICD-10-CM

## 2018-11-26 DIAGNOSIS — I6522 Occlusion and stenosis of left carotid artery: Secondary | ICD-10-CM | POA: Diagnosis not present

## 2018-11-26 DIAGNOSIS — I6523 Occlusion and stenosis of bilateral carotid arteries: Secondary | ICD-10-CM

## 2018-11-26 DIAGNOSIS — R079 Chest pain, unspecified: Secondary | ICD-10-CM

## 2018-11-27 ENCOUNTER — Telehealth: Payer: Self-pay | Admitting: *Deleted

## 2018-11-27 NOTE — Telephone Encounter (Signed)
Notes recorded by Laurine Blazer, LPN on 99/83/3825 at 9:15 AM EDT  Patient notified. Copy to pmd. Follow up scheduled for 12/23/18.    ------   Notes recorded by Herminio Commons, MD on 11/27/2018 at 9:03 AM EDT  Very good pumping function. 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 I will monitor.

## 2018-11-27 NOTE — Telephone Encounter (Signed)
Notes recorded by Laurine Blazer, LPN on 20/60/1561 at 9:11 AM EDT  Patient notified. Copy to pmd. Follow up scheduled for 12/23/18.  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 Repeat in 1 year.

## 2018-12-22 ENCOUNTER — Telehealth: Payer: Self-pay | Admitting: Cardiovascular Disease

## 2018-12-22 NOTE — Telephone Encounter (Signed)
Virtual Visit Pre-Appointment Phone Call  "(Name), I am calling you today to discuss your upcoming appointment. We are currently trying to limit exposure to the virus that causes COVID-19 by seeing patients at home rather than in the office."  "What is the BEST phone number to call the day of the visit?" - [phone]  1. Do you have or have access to (through a family member/friend) a smartphone with video capability that we can use for your visit?" a. If yes - list this number in appt notes as cell (if different from BEST phone #) and list the appointment type as a VIDEO visit in appointment notes b. If no - list the appointment type as a PHONE visit in appointment notes  2. Confirm consent - "In the setting of the current Covid19 crisis, you are scheduled for a (phone or video) visit with your provider on (date) at (time).  Just as we do with many in-office visits, in order for you to participate in this visit, we must obtain consent.  If you'd like, I can send this to your mychart (if signed up) or email for you to review.  Otherwise, I can obtain your verbal consent now.  All virtual visits are billed to your insurance company just like a normal visit would be.  By agreeing to a virtual visit, we'd like you to understand that the technology does not allow for your provider to perform an examination, and thus may limit your provider's ability to fully assess your condition. If your provider identifies any concerns that need to be evaluated in person, we will make arrangements to do so.  Finally, though the technology is pretty good, we cannot assure that it will always work on either your or our end, and in the setting of a video visit, we may have to convert it to a phone-only visit.  In either situation, we cannot ensure that we have a secure connection.  Are you willing to proceed?" STAFF: Did the patient verbally acknowledge consent to telehealth visit? Document YES/NO here:  YES 3.   4. Advise patient to be prepared - "Two hours prior to your appointment, go ahead and check your blood pressure, pulse, oxygen saturation, and your weight (if you have the equipment to check those) and write them all down. When your visit starts, your provider will ask you for this information. If you have an Apple Watch or Kardia device, please plan to have heart rate information ready on the day of your appointment. Please have a pen and paper handy nearby the day of the visit as well."  5. Give patient instructions for MyChart download to smartphone OR Doximity/Doxy.me as below if video visit (depending on what platform provider is using)  6. Inform patient they will receive a phone call 15 minutes prior to their appointment time (may be from unknown caller ID) so they should be prepared to answer    TELEPHONE CALL NOTE  [name] has been deemed a candidate for a follow-up tele-health visit to limit community exposure during the Covid-19 pandemic. I spoke with the patient via phone to ensure availability of phone/video source, confirm preferred email & phone number, and discuss instructions and expectations.  I reminded [name] to be prepared with any vital sign and/or heart rhythm information that could potentially be obtained via home monitoring, at the time of her visit. I reminded [name] to expect a phone call prior to her visit.  Chanda Busing 12/22/2018 9:55 AM   INSTRUCTIONS FOR DOWNLOADING THE MYCHART APP TO SMARTPHONE  - The patient must first make sure to have activated MyChart and know their login information - If Apple, go to [employer] and type in MyChart in the search bar and download the app. If Android, ask patient to go to [employer] and type in [place] in the search bar and download the app. The app is free but as with any other app downloads, their phone may require them to verify saved payment information or Apple/Android  password.  - The patient will need to then log into the app with their MyChart username and password, and select [facility] as their healthcare provider to link the account. When it is time for your visit, go to the MyChart app, find appointments, and click Begin Video Visit. Be sure to Select Allow for your device to access the Microphone and Camera for your visit. You will then be connected, and your provider will be with you shortly.  **If they have any issues connecting, or need assistance please contact MyChart service desk [phone])**  **If using a computer, in order to ensure the best quality for their visit they will need to use either of the following Internet Browsers: [employer], or Google Chrome**  IF USING DOXIMITY or DOXY.ME - The patient will receive a link just prior to their visit by text.     FULL LENGTH CONSENT FOR TELE-HEALTH VISIT   I hereby voluntarily request, consent and authorize [facility] and its employed or contracted physicians, physician assistants, nurse practitioners or other licensed health care professionals (the Practitioner), to provide me with telemedicine health care services (the Services") as deemed necessary by the treating Practitioner. I acknowledge and consent to receive the Services by the Practitioner via telemedicine. I understand that the telemedicine visit will involve communicating with the Practitioner through live audiovisual communication technology and the disclosure of certain medical information by electronic transmission. I acknowledge that I have been given the opportunity to request an in-person assessment or other available alternative prior to the telemedicine visit and am voluntarily participating in the telemedicine visit.  I understand that I have the right to withhold or withdraw my consent to the use of telemedicine in the course of my care at any time, without affecting my right to future care or treatment,  and that the Practitioner or I may terminate the telemedicine visit at any time. I understand that I have the right to inspect all information obtained and/or recorded in the course of the telemedicine visit and may receive copies of available information for a reasonable fee.  I understand that some of the potential risks of receiving the Services via telemedicine include:   Delay or interruption in medical evaluation due to technological equipment failure or disruption;  Information transmitted may not be sufficient (e.g. poor resolution of images) to allow for appropriate medical decision making by the Practitioner; and/or   In rare instances, security protocols could fail, causing a breach of personal health information.  Furthermore, I acknowledge that it is my responsibility to provide information about my medical history, conditions and care that is complete and accurate to the best of my ability. I acknowledge that Practitioner's advice, recommendations, and/or decision may be based on factors not within their control, such as incomplete or inaccurate data provided by me or distortions of diagnostic images or specimens that may result from electronic transmissions. I understand that the  practice of medicine is not an Chief Strategy Officer and that Practitioner makes no warranties or guarantees regarding treatment outcomes. I acknowledge that I will receive a copy of this consent concurrently upon execution via email to the email address I last provided but may also request a printed copy by calling the office of [facility].    I understand that my insurance will be billed for this visit.   I have read or had this consent read to me.  I understand the contents of this consent, which adequately explains the benefits and risks of the Services being provided via telemedicine.   I have been provided ample opportunity to ask questions regarding this consent and the Services and have had my questions  answered to my satisfaction.  I give my informed consent for the services to be provided through the use of telemedicine in my medical care  By participating in this telemedicine visit I agree to the above.

## 2018-12-23 ENCOUNTER — Encounter: Payer: Self-pay | Admitting: Cardiovascular Disease

## 2018-12-23 ENCOUNTER — Telehealth (INDEPENDENT_AMBULATORY_CARE_PROVIDER_SITE_OTHER): Payer: Medicare Other | Admitting: Cardiovascular Disease

## 2018-12-23 VITALS — BP 139/74 | HR 98 | Ht 63.5 in | Wt 162.0 lb

## 2018-12-23 DIAGNOSIS — R079 Chest pain, unspecified: Secondary | ICD-10-CM

## 2018-12-23 DIAGNOSIS — R002 Palpitations: Secondary | ICD-10-CM

## 2018-12-23 DIAGNOSIS — I350 Nonrheumatic aortic (valve) stenosis: Secondary | ICD-10-CM | POA: Diagnosis not present

## 2018-12-23 DIAGNOSIS — I6522 Occlusion and stenosis of left carotid artery: Secondary | ICD-10-CM

## 2018-12-23 NOTE — Patient Instructions (Signed)
Your physician recommends that you schedule a follow-up appointment in: 3 MONTHS WITH DR KONESWARAN  Your physician recommends that you continue on your current medications as directed. Please refer to the Current Medication list given to you today.  Thank you for choosing [facility]!!   
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
## 2019-03-31 ENCOUNTER — Other Ambulatory Visit: Payer: Self-pay

## 2019-03-31 ENCOUNTER — Encounter: Payer: Self-pay | Admitting: Cardiovascular Disease

## 2019-03-31 ENCOUNTER — Ambulatory Visit (INDEPENDENT_AMBULATORY_CARE_PROVIDER_SITE_OTHER): Payer: Medicare Other | Admitting: Cardiovascular Disease

## 2019-03-31 ENCOUNTER — Telehealth: Payer: Self-pay | Admitting: Cardiovascular Disease

## 2019-03-31 VITALS — BP 122/82 | HR 110 | Ht 63.0 in | Wt 175.4 lb

## 2019-03-31 DIAGNOSIS — I6522 Occlusion and stenosis of left carotid artery: Secondary | ICD-10-CM

## 2019-03-31 DIAGNOSIS — I350 Nonrheumatic aortic (valve) stenosis: Secondary | ICD-10-CM

## 2019-03-31 DIAGNOSIS — R002 Palpitations: Secondary | ICD-10-CM

## 2019-03-31 DIAGNOSIS — R079 Chest pain, unspecified: Secondary | ICD-10-CM

## 2019-03-31 NOTE — Patient Instructions (Addendum)
Medication Instructions:  Continue all current medications.  Labwork: none  Testing/Procedures:  Your physician has requested that you have a carotid duplex. This test is an ultrasound of the carotid arteries in your neck. It looks at blood flow through these arteries that supply the brain with blood. Allow one hour for this exam. There are no restrictions or special instructions.  DUE THIS October  Office will contact with results via phone or letter.    Follow-Up: 6 months   Any Other Special Instructions Will Be Listed Below (If Applicable).  If you need a refill on your cardiac medications before your next appointment, please call your pharmacy.

## 2019-03-31 NOTE — Telephone Encounter (Signed)
Precert carotid - left internal carotid artery stenosis - due this October  Scheduled at [facility]  Scheduled on 12/02/2019

## 2019-03-31 NOTE — Progress Notes (Signed)
SUBJECTIVE: [name] is a 69 y.o. female with a history of chest pain.  She also has a history of hiatal hernia and COPD.  She has bilateral hip arthritic pain and feet pain.  She underwent a normal nuclear stress test on 10/31/2018.  Echocardiogram on 11/26/2018 demonstrated normal LV systolic function, EF 65 to 70%, grade 1 diastolic dysfunction, and mild to moderate aortic stenosis.  Carotid Dopplers on 11/26/2018 demonstrated 60 to 79% left internal carotid artery stenosis.  She denies chest pain, palpitations, leg swelling, shortness of breath.  Energy levels have remained stable over the last several months.   Social history:She and her husband are separated. She lives with her son. She has 2 sons, the other lives next door.She used to work for [employer].  Review of Systems: As per "subjective", otherwise negative.  Allergies  Allergen Reactions  . Aspirin   . Atorvastatin Other (See Comments)    Muscle pain  . Crestor [Rosuvastatin Calcium] Other (See Comments)    Muscle pain    Current Outpatient Medications  Medication Sig Dispense Refill  . acetaminophen (TYLENOL) 650 MG CR tablet Take 650 mg by mouth every 8 (eight) hours as needed for pain.    Marland Kitchen albuterol (VENTOLIN HFA) 108 (90 Base) MCG/ACT inhaler Inhale 1-2 puffs into the lungs every 4 (four) hours as needed.    Ailene Ards ELLIPTA 62.5-25 MCG/INH AEPB Inhale 1 puff into the lungs daily.    . cetirizine (ZYRTEC) 10 MG tablet Take 10 mg by mouth 2 (two) times daily.     Marland Kitchen gabapentin (NEURONTIN) 300 MG capsule Take 300 mg by mouth at bedtime.    Marland Kitchen levothyroxine (SYNTHROID) 25 MCG tablet Take 25 mcg by mouth every morning.    . Multiple Vitamin (MULTIVITAMIN) tablet Take 1 tablet by mouth daily.    . nitroGLYCERIN (NITROSTAT) 0.4 MG SL tablet Place 1 tablet under the tongue every 5 (five) minutes x 3 doses as needed for chest pain.    Marland Kitchen omeprazole (PRILOSEC) 40 MG capsule Take 1 capsule by mouth  daily.     No current facility-administered medications for this visit.    Past Medical History:  Diagnosis Date  . Pneumonia approx. 2006    Past Surgical History:  Procedure Laterality Date  . CHOLECYSTECTOMY  08/05/2016  . ORIF PATELLA  05/08/2011   Procedure: OPEN REDUCTION INTERNAL (ORIF) FIXATION PATELLA;  Surgeon: Toni Arthurs, MD;  Location: [facility];  Service: Orthopedics;  Laterality: Right;  . TUBAL LIGATION  1985    Social History   Socioeconomic History  . Marital status: Married    Spouse name: Not on file  . Number of children: Not on file  . Years of education: Not on file  . Highest education level: Not on file  Occupational History  . Not on file  Tobacco Use  . Smoking status: Former Smoker    Packs/day: 1.00    Years: 40.00    Pack years: 40.00    Types: Cigarettes    Quit date: 01/27/2011    Years since quitting: 8.1  . Smokeless tobacco: Never Used  Substance and Sexual Activity  . Alcohol use: No  . Drug use: No  . Sexual activity: Not on file  Other Topics Concern  . Not on file  Social History Narrative  . Not on file   Social Determinants of Health   Financial Resource Strain:   . Difficulty of Paying Living Expenses: Not on  file  Food Insecurity:   . Worried About Charity fundraiser in the Last Year: Not on file  . Ran Out of Food in the Last Year: Not on file  Transportation Needs:   . Lack of Transportation (Medical): Not on file  . Lack of Transportation (Non-Medical): Not on file  Physical Activity:   . Days of Exercise per Week: Not on file  . Minutes of Exercise per Session: Not on file  Stress:   . Feeling of Stress : Not on file  Social Connections:   . Frequency of Communication with Friends and Family: Not on file  . Frequency of Social Gatherings with Friends and Family: Not on file  . Attends Religious Services: Not on file  . Active Member of Clubs or Organizations: Not on file  . Attends Archivist  Meetings: Not on file  . Marital Status: Not on file  Intimate Partner Violence:   . Fear of Current or Ex-Partner: Not on file  . Emotionally Abused: Not on file  . Physically Abused: Not on file  . Sexually Abused: Not on file    Orson Slick, LPN was present throughout the entirety of the encounter.  Vitals:   03/31/19 1256  BP: 122/82  Pulse: (!) 110  SpO2: 97%  Weight: 175 lb 6.4 oz (79.6 kg)  Height: 5\' 3"  (1.6 m)    Wt Readings from Last 3 Encounters:  03/31/19 175 lb 6.4 oz (79.6 kg)  12/23/18 162 lb (73.5 kg)  10/27/18 169 lb (76.7 kg)     PHYSICAL EXAM General: NAD HEENT: Normal. Neck: No JVD, no thyromegaly. Lungs: Clear to auscultation bilaterally with normal respiratory effort. CV: Mildly tachycardic, regular rhythm, normal S1/S2, no S3/S4, no murmur. No pretibial or periankle edema.  No carotid bruit.   Abdomen: Soft, nontender, no distention.  Neurologic: Alert and oriented.  Psych: Normal affect. Skin: Normal. Musculoskeletal: No gross deformities.      Labs: Lab Results  Component Value Date/Time   CREATININE 0.65 05/09/2011 05:06 PM     Lipids: No results found for: LDLCALC, LDLDIRECT, CHOL, TRIG, HDL     ASSESSMENT AND PLAN:  1.  Aortic stenosis: Mild to moderate in severity by echocardiogram in October 2020.  I will monitor.  2.  Left internal carotid artery stenosis: 60 to 79% stenosis by carotid Dopplers in October 2020.  I will repeat in October 2021. Statin intolerant.  3.  Chest pain: No recurrences. Nuclear stress test was normal. I will monitor.  4. Palpitations: She is mildly tachycardic today but asymptomatic.  I will monitor.     Disposition: Follow up 6 months virtual visit   Kate Sable, M.D., F.A.C.C.

## 2019-09-30 ENCOUNTER — Telehealth: Payer: Medicare Other | Admitting: Cardiovascular Disease

## 2020-07-25 ENCOUNTER — Other Ambulatory Visit: Payer: Self-pay | Admitting: Physician Assistant

## 2021-04-06 ENCOUNTER — Encounter: Payer: Self-pay | Admitting: Pulmonary Disease

## 2021-04-27 ENCOUNTER — Ambulatory Visit (INDEPENDENT_AMBULATORY_CARE_PROVIDER_SITE_OTHER): Payer: Medicare Other | Admitting: Pulmonary Disease

## 2021-04-27 ENCOUNTER — Encounter: Payer: Self-pay | Admitting: Pulmonary Disease

## 2021-04-27 VITALS — BP 132/80 | HR 112 | Temp 98.9°F | Ht 63.0 in | Wt 187.0 lb

## 2021-04-27 DIAGNOSIS — R0609 Other forms of dyspnea: Secondary | ICD-10-CM | POA: Diagnosis not present

## 2021-04-27 DIAGNOSIS — R0683 Snoring: Secondary | ICD-10-CM

## 2021-04-27 DIAGNOSIS — I350 Nonrheumatic aortic (valve) stenosis: Secondary | ICD-10-CM | POA: Diagnosis not present

## 2021-04-27 NOTE — Progress Notes (Signed)
? ?[facility] ? ?Chief Complaint  ?Patient presents with  ? Consult  ?  Consult for SOB   ? ? ?Past Surgical History:  ?She  has a past surgical history that includes Tubal ligation (1985); ORIF patella (05/08/2011); and Cholecystectomy (08/05/2016). ? ?Past Medical History:  ?Pneumonia, Hiatal hernia, GERD, Neuropathy, Aortic stenosis, HLD ? ?Constitutional:  ?BP 132/80 (BP Location: Left Arm, Patient Position: Sitting)   Pulse (!) 112   Temp 98.9 ?F (37.2 ?C) (Temporal)   Ht 5\' 3"  (1.6 m)   Wt 187 lb (84.8 kg)   SpO2 97% Comment: ra  BMI 33.13 kg/m?  ? ?Brief Summary:  ?[name] is a 71 y.o. female former smoker with dyspnea. ?  ? ? ? ?Subjective:  ? ?She has noticed trouble with her breathing for years.  This has been getting progressively worse.  She gets fatigued easily and can't walk more than about 100 feet.  She feels heavy in her chest and sometimes feels dizzy.  Her muscles get achy.  She was previously seen by Dr. 62 for aortic stenosis. Her most recent Echo shows progression of aortic stenosis. ? ?She used to smoke 1 ppd.  She quit about 10 years ago.  CT chest from 2019 showed mild emphysema.  She has not done a PFT before.  She reports possible exposure to talc dust from frequent use of baby powder.  She was told she has COPD, but she doesn't feel like this is correct.  She has tried stioto.  This didn't help her breathing, but it did make her chest sore.  She uses albuterol sparing, and she isn't sure this really helps either.  She is not having cough, wheeze, or sputum. ? ?She snores, and wakes up feeling like she can't breath.  She is tired and sleepy all the time during the day.  Her Epworth score is 5 out of 24. ? ?Physical Exam:  ? ?Appearance - well kempt  ? ?ENMT - no sinus tenderness, no oral exudate, no LAN, Mallampati 3 airway, no stridor ? ?Respiratory - equal breath sounds bilaterally, no wheezing or rales ? ?CV - s1s2 regular  rate and rhythm, 3/6 SM ? ?Ext - no clubbing, no edema ? ?Skin - no rashes ? ?Psych - normal mood and affect ?  ?Pulmonary testing:  ? ? ?Chest Imaging:  ?CT chest 11/04/17 >> atherosclerosis, small hiatal hernia, mild emphysema ? ?Sleep Tests:  ? ? ?Cardiac Tests:  ?Echo 04/24/21 >> EF greater than 70%, grade 1 DD, mod/severe AS, mild LA dilation ? ?Social History:  ?She  reports that she quit smoking about 10 years ago. Her smoking use included cigarettes. She has a 40.00 pack-year smoking history. She has never used smokeless tobacco. She reports that she does not drink alcohol and does not use drugs. ? ?Family History:  ?Her family history includes Heart attack in her father; Ovarian cancer in her mother. ?  ? ?Discussion:  ?She has progressive dyspnea on exertion.  This is associated with exertion chest pain, fatigue, and dizziness.  She has non rheumatic aortic stenosis and most recent Echo indicates this has progressed to moderate-severe stage.  I am concerned a significant portion of her symptoms are related to this. ? ?She does have history of tobacco abuse and CT chest from 2019 showed mild emphysema.  It is possible that she has COPD, but she didn't report symptomatic benefit from trial of inhaler therapy.  In fact,  this made her symptoms worse and I suspect this could be related to tachycardia from inhaler therapy worsening symptoms related to aortic stenosis. ? ?She has snoring, sleep disruption, apnea, and daytime sleepiness.  She has history of cardiac atherosclerosis.  I am concerned she could have obstructive sleep apnea. ? ?Assessment/Plan:  ? ?Moderate to severe non-rheumatic aortic stenosis, Grade 1 diastolic dysfunction, coronary atherosclerosis. ?- she reports that cardiology referral has been placed by her PCP; advised her to contact our office if she needs assistance expediting this appointment ? ?History of tobacco abuse, changes of emphysema on previous CT chest, and possible COPD. ?- I am  not convinced this is a significant cause of her symptoms at present ?- advised her to stop stiolto since this is making her symptoms worse ?- will arrange for a chest xray ?- defer PFT testing until her aortic stenosis is further assessed and managed; concerned the exertion related to doing PFT maneuvers could cause syncopal event in this setting ? ?Talc exposure. ?- she reports frequent exposure from chronic baby powder use ?- uncertain significance ?- will get chest xray ? ?Snoring. ?- will arrange for home sleep study to further assess for the presence of obstructive sleep apnea ? ?Time Spent Involved in Patient Care on Day of Examination:  ?50 minutes ? ?Follow up:  ? ?Patient Instructions  ?You can stop using stiolto ? ?[facility] ? ?Call if you need help getting an appointment with cardiology ? ?Will arrange for sleep study ? ?Will call to arrange for follow up after sleep study reviewed ? ? ?Medication List:  ? ?Allergies as of 04/27/2021   ? ?   Reactions  ? Aspirin   ? Atorvastatin Other (See Comments)  ? Muscle pain  ? Crestor [rosuvastatin Calcium] Other (See Comments)  ? Muscle pain  ? ?  ? ?  ?Medication List  ?  ? ?  ? Accurate as of April 27, 2021 10:45 AM. If you have any questions, ask your nurse or doctor.  ?  ?  ? ?  ? ?STOP taking these medications   ? ?Anoro Ellipta 62.5-25 MCG/ACT Aepb ?Generic drug: umeclidinium-vilanterol ?Stopped by: Coralyn HellingVineet Cina Klumpp, MD ?  ?gabapentin 300 MG capsule ?Commonly known as: NEURONTIN ?Stopped by: Coralyn HellingVineet Lisl Slingerland, MD ?  ?levothyroxine 25 MCG tablet ?Commonly known as: SYNTHROID ?Stopped by: Coralyn HellingVineet Janeisha Ryle, MD ?  ?Stiolto Respimat 2.5-2.5 MCG/ACT Aers ?Generic drug: Tiotropium Bromide-Olodaterol ?Stopped by: Coralyn HellingVineet Greidys Deland, MD ?  ? ?  ? ?TAKE these medications   ? ?acetaminophen 650 MG CR tablet ?Commonly known as: TYLENOL ?Take 650 mg by mouth every 8 (eight) hours as needed for pain. ?  ?albuterol 108 (90 Base) MCG/ACT inhaler ?Commonly known as:  VENTOLIN HFA ?Inhale 1-2 puffs into the lungs every 4 (four) hours as needed. ?  ?cetirizine 10 MG tablet ?Commonly known as: ZYRTEC ?Take 10 mg by mouth 2 (two) times daily. ?  ?multivitamin tablet ?Take 1 tablet by mouth daily. ?  ?nitroGLYCERIN 0.4 MG SL tablet ?Commonly known as: NITROSTAT ?Place 1 tablet under the tongue every 5 (five) minutes x 3 doses as needed for chest pain. ?  ?omeprazole 40 MG capsule ?Commonly known as: PRILOSEC ?Take 1 capsule by mouth daily. ?  ?pregabalin 75 MG capsule ?Commonly known as: LYRICA ?Take 75 mg by mouth 3 (three) times daily as needed. ?  ?rosuvastatin 5 MG tablet ?Commonly known as: CRESTOR ?Take 5 mg by mouth every other day. ?  ? ?  ? ? ?  Signature:  ?Coralyn Helling, MD ?[facility] ?Pager - [phone] ?04/27/2021, 10:45 AM ?  ? ? ? ? ? ? ? ? ?

## 2021-04-27 NOTE — Patient Instructions (Signed)
You can stop using stiolto ? ?Chest xray at [facility] ? ?Call if you need help getting an appointment with cardiology ? ?Will arrange for sleep study ? ?Will call to arrange for follow up after sleep study reviewed ? ?

## 2021-05-04 ENCOUNTER — Telehealth: Payer: Self-pay | Admitting: Pulmonary Disease

## 2021-05-05 NOTE — Telephone Encounter (Signed)
Called and spoke with pt to see what results she was checking to see if we had the results and she said that the assistant of her PCP was supposed to be sending results of a CT to Dr. Craige Cotta for him to be able to review. ? ?Routing to Meagan for her to check on this at the [place] office to see if anything was sent on pt. ?

## 2021-05-08 NOTE — Telephone Encounter (Signed)
Called and spoke to patient.  ?She was made aware that we have not received results yet and is going to contact PCP. Nothing further needed.  ?

## 2021-05-10 ENCOUNTER — Telehealth: Payer: Self-pay | Admitting: Pulmonary Disease

## 2021-05-10 NOTE — Telephone Encounter (Signed)
ATC patient X2 to let her know we received scan from PCP office yesterday and that Dr. Craige Cotta is out of office until Monday but the scan is with me and will be sent to him when he comes back to office. No VM available  ? ? ?

## 2021-05-10 NOTE — Telephone Encounter (Signed)
Called and spoke to patient and informed her of situation. [name] voiced understanding and is aware I will call her once I hear back from Dr. Craige Cotta. Routing to Dr. Craige Cotta to make him aware that I am faxing over her CT results to [place] office for him to look at  ?

## 2021-05-11 NOTE — Telephone Encounter (Signed)
I reviewed CT chest results in care everywhere. ? ?She needs first available appointment with me or an NP to discuss CT chest findings.  Please make sure that a disk with images from CT are available to review at the time of her appointment. ?

## 2021-05-11 NOTE — Telephone Encounter (Signed)
Called and spoke to patient. She voiced understanding. She states she does not have transportation to go to [place], and does not have access to her mychart and doesn't know how to use it. No availability in [facility] office for Dr. Craige Cotta until June 2023. Patient wants to know if she can do a telephone call with Dr. Craige Cotta to discuss results.  ? ?Dr. Craige Cotta please advise if its ok to double book or do telephone call with patient? Thanks!  ?

## 2021-05-15 NOTE — Telephone Encounter (Signed)
Can she get scheduled to see me in [place] office at 3 pm on one of the days that I am there next week? ?

## 2021-05-15 NOTE — Telephone Encounter (Signed)
Called and spoke to patient. Added her on to 3:30 slot on Thursday. Nothing further needed.  ?

## 2021-05-25 ENCOUNTER — Encounter: Payer: Self-pay | Admitting: Pulmonary Disease

## 2021-05-25 ENCOUNTER — Ambulatory Visit (INDEPENDENT_AMBULATORY_CARE_PROVIDER_SITE_OTHER): Payer: Medicare Other | Admitting: Pulmonary Disease

## 2021-05-25 VITALS — BP 150/70 | HR 111 | Temp 98.6°F | Ht 63.0 in | Wt 187.2 lb

## 2021-05-25 DIAGNOSIS — R911 Solitary pulmonary nodule: Secondary | ICD-10-CM | POA: Diagnosis not present

## 2021-05-25 DIAGNOSIS — R0683 Snoring: Secondary | ICD-10-CM | POA: Diagnosis not present

## 2021-05-25 DIAGNOSIS — R0609 Other forms of dyspnea: Secondary | ICD-10-CM | POA: Diagnosis not present

## 2021-05-25 NOTE — Progress Notes (Addendum)
[name]  Chief Complaint  Patient presents with   Follow-up    Patient has no concerns, states she is no longer on daily inhaler just has rescue inhaler. Is here to go over CT scan    Past Surgical History:  She  has a past surgical history that includes Tubal ligation (1985); ORIF patella (05/08/2011); and Cholecystectomy (08/05/2016).  Past Medical History:  Pneumonia, Hiatal hernia, GERD, Neuropathy, Aortic stenosis, HLD  Constitutional:  BP (!) 150/70 (BP Location: Left Arm, Patient Position: Sitting, Cuff Size: Normal)   Pulse (!) 111   Temp 98.6 F (37 C) (Oral)   Ht 5\' 3"  (1.6 m)   Wt 187 lb 3.2 oz (84.9 kg)   SpO2 94%   BMI 33.16 kg/m   Brief Summary:  [name] is a 71 y.o. female former smoker with dyspnea.      Subjective:   She is here with her brother.  She had low dose CT chest for lung cancer screening.  Showed moderate emphysema and 2.1 cm cavitary nodule in LLL with surrounding tree in bud.  She had follow up chest xray on 05/07/21 that didn't show the nodule.  She has trouble with reflux and feels like food goes down the wrong way sometimes.  She is not having cough, wheeze, sputum, fever, sweats, weight loss, chest pain, or hemoptysis.  She had Echo done and shows severe aortic stenosis.  She has cardiology appointment in June.    Physical Exam:   Appearance - well kempt   ENMT - no sinus tenderness, no oral exudate, no LAN, Mallampati 3 airway, no stridor  Respiratory - equal breath sounds bilaterally, no wheezing or rales  CV - s1s2 regular rate and rhythm, 3/6 SM  Ext - no clubbing, no edema  Skin - no rashes  Psych - normal mood and affect    Pulmonary testing:    Chest Imaging:  CT chest 11/04/17 >> atherosclerosis, small hiatal hernia, mild emphysema LDCT chest 05/01/21 >> moderate centrilobular and paraseptal emphysema, 21.3 mm nodule LLL with central cavitation, surrounding  tree in bud  Sleep Tests:    Cardiac Tests:  Echo 04/24/21 >> EF greater than 70%, grade 1 DD, mod/severe AS, mild LA dilation  Social History:  She  reports that she quit smoking about 10 years ago. Her smoking use included cigarettes. She has a 40.00 pack-year smoking history. She has never used smokeless tobacco. She reports that she does not drink alcohol and does not use drugs.  Family History:  Her family history includes Heart attack in her father; Ovarian cancer in her mother.    Discussion:  Reviewed her CT image.  We reviewed several options: 1) bronchoscopy now, 2) PET scan, 3) follow up CT without contrast in 3 months.  Given severity of aortic stenosis would be concerned about proceeding with bronchoscopy at this time until she has further input from cardiology.  Not sure PET scan would really add anything since positive uptake could be from infectious/inflammatory process or malignancy.  I don't think she would lose any options of therapy if we had short term radiographic surveillance.  Assessment/Plan:   Lung nodule Lt lower lobe. - will arrange for CT chest without contrast in July 2023  Moderate to severe non-rheumatic aortic stenosis, Grade 1 diastolic dysfunction, coronary atherosclerosis. - she has appointment with cardiology scheduled  History of tobacco abuse, changes of emphysema on previous CT chest, and possible COPD. -  no improvement with previous trial of stiolto; hold off additional inhaler therapy for now - defer PFT testing until her aortic stenosis is further assessed and managed; concerned the exertion related to doing PFT maneuvers could cause syncopal event in this setting  Snoring. - home sleep study pending  Time Spent Involved in Patient Care on Day of Examination:  37 minutes  Follow up:   Patient Instructions  Will schedule CT chest for July 2023 at [facility] and then arrange for follow up after that  Medication List:    Allergies as of 05/25/2021       Reactions   Aspirin    Atorvastatin Other (See Comments)   Muscle pain   Crestor [rosuvastatin Calcium] Other (See Comments)   Muscle pain        Medication List        Accurate as of May 25, 2021  3:21 PM. If you have any questions, ask your nurse or doctor.          STOP taking these medications    nitroGLYCERIN 0.4 MG SL tablet Commonly known as: NITROSTAT Stopped by: Coralyn Helling, MD       TAKE these medications    acetaminophen 650 MG CR tablet Commonly known as: TYLENOL Take 650 mg by mouth every 8 (eight) hours as needed for pain.   albuterol 108 (90 Base) MCG/ACT inhaler Commonly known as: VENTOLIN HFA Inhale 1-2 puffs into the lungs every 4 (four) hours as needed.   cetirizine 10 MG tablet Commonly known as: ZYRTEC Take 10 mg by mouth 2 (two) times daily.   multivitamin tablet Take 1 tablet by mouth daily.   omeprazole 40 MG capsule Commonly known as: PRILOSEC Take 1 capsule by mouth daily.   pregabalin 75 MG capsule Commonly known as: LYRICA Take 75 mg by mouth 3 (three) times daily as needed.   rosuvastatin 5 MG tablet Commonly known as: CRESTOR Take 5 mg by mouth every other day.        Signature:  Coralyn Helling, MD [facility] Pager - [phone] 05/25/2021, 3:21 PM

## 2021-05-25 NOTE — Patient Instructions (Signed)
Will schedule CT chest for July 2023 at [facility] and then arrange for follow up after that ?

## 2021-06-21 ENCOUNTER — Encounter: Payer: Self-pay | Admitting: Cardiology

## 2021-06-21 ENCOUNTER — Encounter: Payer: Self-pay | Admitting: *Deleted

## 2021-06-21 ENCOUNTER — Ambulatory Visit (INDEPENDENT_AMBULATORY_CARE_PROVIDER_SITE_OTHER): Payer: Medicare Other | Admitting: Cardiology

## 2021-06-21 ENCOUNTER — Telehealth: Payer: Self-pay | Admitting: Cardiology

## 2021-06-21 VITALS — BP 130/70 | HR 102 | Ht 63.0 in | Wt 183.4 lb

## 2021-06-21 DIAGNOSIS — I6529 Occlusion and stenosis of unspecified carotid artery: Secondary | ICD-10-CM

## 2021-06-21 DIAGNOSIS — I350 Nonrheumatic aortic (valve) stenosis: Secondary | ICD-10-CM | POA: Diagnosis not present

## 2021-06-21 DIAGNOSIS — R079 Chest pain, unspecified: Secondary | ICD-10-CM

## 2021-06-21 DIAGNOSIS — E785 Hyperlipidemia, unspecified: Secondary | ICD-10-CM | POA: Insufficient documentation

## 2021-06-21 MED ORDER — CLOPIDOGREL BISULFATE 75 MG PO TABS: 75.0000 mg | ORAL_TABLET | Freq: Every day | ORAL | 3 refills | Status: DC

## 2021-06-21 NOTE — Telephone Encounter (Signed)
Checking percert on the following patient for testing scheduled at [facility].     LEXISCAN   06-28-2021

## 2021-06-21 NOTE — Progress Notes (Signed)
Clinical Summary Ms. Moritz is a 71 y.o.female former patient of Dr Purvis Sheffield last seen 03/2019, this is our first visit together. Seen today as a new patient for the following medical problems.   1.History of chest pain - underwent a normal nuclear stress test on 10/31/2018. - Echocardiogram on 11/26/2018 demonstrated normal LV systolic function, EF 65 to 70%, grade 1 diastolic dysfunction, and mild to moderate aortic stenosis.  - ongoing about 2 year - midchest, aching pain. Can occur at rest or with activity. 5/10 in severity. Lasts a few minutes. Occurs few times a month. Not positional. No relation to food or eating - CAD risk factors: tobacco, hyperlipiemia.     2. COPD - followed by pulmonary - noted on CT scan, does not appear she has had PFTs  3. Aortic stenosis - 10/2018 echo: LVEF 65-70%, AV mean grad 13, AVA VTI 1.15, DI 0.45, SVI 37 - 03/2021 echo [facility]: LVEF >70%, grade I dd, mod to severe AS mean grad 17, AVA VTI 0.9, SVI 29, DI 0.36 - DOE with activities. DOE with sweeping, mopping. DOE walking room to room at home   4. Carotid stenosis 10/2018 RICA 1-39%, LICA 60-79%  Reports high bp on ASA  5. Lung nodule - followed by pulmonary - former smoker x 50 years. Can have some cough, wheezing. CT scan consistent with emphysema.        Past Medical History:  Diagnosis Date   Aortic stenosis    GERD (gastroesophageal reflux disease)    Hiatal hernia    Hyperlipidemia    Neuropathy    Pneumonia approx. 2006     Allergies  Allergen Reactions   Aspirin    Atorvastatin Other (See Comments)    Muscle pain   Crestor [Rosuvastatin Calcium] Other (See Comments)    Muscle pain     Current Outpatient Medications  Medication Sig Dispense Refill   acetaminophen (TYLENOL) 650 MG CR tablet Take 650 mg by mouth every 8 (eight) hours as needed for pain.     albuterol (VENTOLIN HFA) 108 (90 Base) MCG/ACT inhaler Inhale 1-2 puffs into the lungs every 4  (four) hours as needed.     cetirizine (ZYRTEC) 10 MG tablet Take 10 mg by mouth 2 (two) times daily.      clopidogrel (PLAVIX) 75 MG tablet Take 1 tablet (75 mg total) by mouth daily. 90 tablet 3   Multiple Vitamin (MULTIVITAMIN) tablet Take 1 tablet by mouth daily.     omeprazole (PRILOSEC) 40 MG capsule Take 1 capsule by mouth daily.     pregabalin (LYRICA) 75 MG capsule Take 75 mg by mouth 3 (three) times daily as needed.     rosuvastatin (CRESTOR) 5 MG tablet Take 5 mg by mouth every other day.     No current facility-administered medications for this visit.     Past Surgical History:  Procedure Laterality Date   CHOLECYSTECTOMY  08/05/2016   ORIF PATELLA  05/08/2011   Procedure: OPEN REDUCTION INTERNAL (ORIF) FIXATION PATELLA;  Surgeon: Toni Arthurs, MD;  Location: [facility];  Service: Orthopedics;  Laterality: Right;   TUBAL LIGATION  1985     Allergies  Allergen Reactions   Aspirin    Atorvastatin Other (See Comments)    Muscle pain   Crestor [Rosuvastatin Calcium] Other (See Comments)    Muscle pain      Family History  Problem Relation Age of Onset   Ovarian cancer Mother    Heart  attack Father      Social History Ms. Heslin reports that she quit smoking about 10 years ago. Her smoking use included cigarettes. She has a 40.00 pack-year smoking history. She has never used smokeless tobacco. Ms. Twombly reports no history of alcohol use.   Review of Systems CONSTITUTIONAL: No weight loss, fever, chills, weakness or fatigue.  HEENT: Eyes: No visual loss, blurred vision, double vision or yellow sclerae.No hearing loss, sneezing, congestion, runny nose or sore throat.  SKIN: No rash or itching.  CARDIOVASCULAR: per hpi RESPIRATORY: No shortness of breath, cough or sputum.  GASTROINTESTINAL: No anorexia, nausea, vomiting or diarrhea. No abdominal pain or blood.  GENITOURINARY: No burning on urination, no polyuria NEUROLOGICAL: No headache, dizziness, syncope,  paralysis, ataxia, numbness or tingling in the extremities. No change in bowel or bladder control.  MUSCULOSKELETAL: No muscle, back pain, joint pain or stiffness.  LYMPHATICS: No enlarged nodes. No history of splenectomy.  PSYCHIATRIC: No history of depression or anxiety.  ENDOCRINOLOGIC: No reports of sweating, cold or heat intolerance. No polyuria or polydipsia.  Marland Kitchen   Physical Examination Vitals:   06/21/21 0915 06/21/21 1008  BP: (!) 90/56 130/70  Pulse: (!) 102   SpO2: 97%    Filed Weights   06/21/21 0915  Weight: 183 lb 6.4 oz (83.2 kg)    Gen: resting comfortably, no acute distress HEENT: no scleral icterus, pupils equal round and reactive, no palptable cervical adenopathy,  CV: RRR, 3/6 systolic murmur rusb, no jvd Resp: Clear to auscultation bilaterally GI: abdomen is soft, non-tender, non-distended, normal bowel sounds, no hepatosplenomegaly MSK: extremities are warm, no edema.  Skin: warm, no rash Neuro:  no focal deficits Psych: appropriate affect      Assessment and Plan  Aortic stenosis - moderate to severe AS with majority of criteria supporting moderate. Low SVI suggesting lower than expected gradients are due to paradoxical low flow low gradient aortic stenosis - would repeat echo in 09/2021.  - at this time valve not to severity that would appear to explain her degree of DOE alone   2. Chest pain - recent chest pain and DOE - will obtain lexiscan - EKG todays shows mild sinus tach, no acute ischemic changes  3. Carotid stenosis - repeat carotid US - reports high bp's on ASA in the past, given degree of carotid stenosis needs antiplatelet agents, will start plavix 75mg  daily as alternative. She is on statin  4. Lung nodule/SOB - will let pulmonary know ok to pursue bronch, PFTs from cardiac standpoint if indicated.       , M.D.

## 2021-06-21 NOTE — Patient Instructions (Addendum)
Medication Instructions:  Your physician has recommended you make the following change in your medication:  Start plavix 75 mg daily Continue other medications the same  Labwork: none  Testing/Procedures: Your physician has requested that you have a lexiscan myoview. For further information please visit [email or web address] Please follow instruction sheet, as given. Your physician has requested that you have a carotid duplex. This test is an ultrasound of the carotid arteries in your neck. It looks at blood flow through these arteries that supply the brain with blood. Allow one hour for this exam. There are no restrictions or special instructions.  Follow-Up: Your physician recommends that you schedule a follow-up appointment in: 3 months  Any Other Special Instructions Will Be Listed Below (If Applicable).  If you need a refill on your cardiac medications before your next appointment, please call your pharmacy.

## 2021-06-28 ENCOUNTER — Encounter (HOSPITAL_COMMUNITY): Payer: Medicare Other

## 2021-07-12 ENCOUNTER — Telehealth: Payer: Self-pay | Admitting: Pulmonary Disease

## 2021-07-12 NOTE — Telephone Encounter (Signed)
Error. Will close encounter.

## 2021-08-15 ENCOUNTER — Ambulatory Visit (HOSPITAL_COMMUNITY)
Admission: RE | Admit: 2021-08-15 | Discharge: 2021-08-15 | Disposition: A | Discharge status: Home / Self Care | Payer: Medicare Other | Source: Ambulatory Visit | Attending: Pulmonary Disease | Admitting: Pulmonary Disease

## 2021-08-15 DIAGNOSIS — I2510 Atherosclerotic heart disease of native coronary artery without angina pectoris: Secondary | ICD-10-CM | POA: Insufficient documentation

## 2021-08-15 DIAGNOSIS — R911 Solitary pulmonary nodule: Secondary | ICD-10-CM | POA: Insufficient documentation

## 2021-08-15 DIAGNOSIS — I700 Atherosclerosis of aorta: Secondary | ICD-10-CM | POA: Insufficient documentation

## 2021-08-15 DIAGNOSIS — J439 Emphysema, unspecified: Secondary | ICD-10-CM | POA: Insufficient documentation

## 2021-08-15 DIAGNOSIS — R918 Other nonspecific abnormal finding of lung field: Secondary | ICD-10-CM | POA: Diagnosis not present

## 2021-08-15 DIAGNOSIS — I358 Other nonrheumatic aortic valve disorders: Secondary | ICD-10-CM | POA: Diagnosis not present

## 2021-08-17 ENCOUNTER — Telehealth: Payer: Self-pay | Admitting: Pulmonary Disease

## 2021-08-17 NOTE — Telephone Encounter (Signed)
Called and spoke to patient about CT results. She voiced understanding. Nothing further needed. (Result note)

## 2021-09-06 ENCOUNTER — Ambulatory Visit (INDEPENDENT_AMBULATORY_CARE_PROVIDER_SITE_OTHER): Payer: Medicare Other

## 2021-09-06 DIAGNOSIS — I6522 Occlusion and stenosis of left carotid artery: Secondary | ICD-10-CM | POA: Diagnosis not present

## 2021-09-06 DIAGNOSIS — I6529 Occlusion and stenosis of unspecified carotid artery: Secondary | ICD-10-CM

## 2021-09-07 ENCOUNTER — Ambulatory Visit: Payer: Medicare Other | Admitting: Pulmonary Disease

## 2021-09-12 ENCOUNTER — Ambulatory Visit (INDEPENDENT_AMBULATORY_CARE_PROVIDER_SITE_OTHER): Payer: Medicare Other | Admitting: Cardiology

## 2021-09-12 ENCOUNTER — Encounter: Payer: Self-pay | Admitting: Cardiology

## 2021-09-12 VITALS — BP 136/72 | HR 111 | Ht 63.0 in | Wt 179.0 lb

## 2021-09-12 DIAGNOSIS — I350 Nonrheumatic aortic (valve) stenosis: Secondary | ICD-10-CM

## 2021-09-12 DIAGNOSIS — R079 Chest pain, unspecified: Secondary | ICD-10-CM | POA: Diagnosis not present

## 2021-09-12 DIAGNOSIS — I6529 Occlusion and stenosis of unspecified carotid artery: Secondary | ICD-10-CM | POA: Diagnosis not present

## 2021-09-12 DIAGNOSIS — R0602 Shortness of breath: Secondary | ICD-10-CM

## 2021-09-12 NOTE — Progress Notes (Signed)
Clinical Summary [name] is a 71 y.o.female  1.History of chest pain - underwent a normal nuclear stress test on 10/31/2018. - Echocardiogram on 11/26/2018 demonstrated normal LV systolic function, EF 65 to 70%, grade 1 diastolic dysfunction, and mild to moderate aortic stenosis.   - ongoing about 2 year - midchest, aching pain. Can occur at rest or with activity. 5/10 in severity. Lasts a few minutes. Occurs few times a month. Not positional. No relation to food or eating - CAD risk factors: tobacco, hyperlipiemia.     -last visit discussed nuclear stress, dont see it was done.  - symptoms off and on, overall stable from last visit - onoing SOB/DOE with activities, just room to room at home.    2. COPD - followed by pulmonary - noted on CT scan, does not appear she has had PFTs   3. Aortic stenosis - 10/2018 echo: LVEF 65-70%, AV mean grad 13, AVA VTI 1.15, DI 0.45, SVI 37 - 03/2021 echo [facility]: LVEF >70%, grade I dd, mod to severe AS mean grad 17, AVA VTI 0.9, SVI 29, DI 0.36 - DOE with activities. DOE with sweeping, mopping. DOE walking room to room at home   -chronic stable SOB/DOE, chest pain. Unclear if valve related.    4. Carotid stenosis 10/2018 RICA 1-39%, LICA 60-79% 08/2021 carotid US: RICA 1-39%, LICA total occlusion Reports high bp on ASA, we started plavix as alternative   5. Lung nodule - followed by pulmonary - former smoker x 50 years. Can have some cough, wheezing. CT scan consistent with emphysema.      6.Psoriatic arthritis  Past Medical History:  Diagnosis Date   Aortic stenosis    GERD (gastroesophageal reflux disease)    Hiatal hernia    Hyperlipidemia    Neuropathy    Pneumonia approx. 2006     Allergies  Allergen Reactions   Aspirin    Atorvastatin Other (See Comments)    Muscle pain   Crestor [Rosuvastatin Calcium] Other (See Comments)    Muscle pain     Current Outpatient Medications  Medication Sig Dispense Refill    acetaminophen (TYLENOL) 650 MG CR tablet Take 650 mg by mouth every 8 (eight) hours as needed for pain.     albuterol (VENTOLIN HFA) 108 (90 Base) MCG/ACT inhaler Inhale 1-2 puffs into the lungs every 4 (four) hours as needed.     cetirizine (ZYRTEC) 10 MG tablet Take 10 mg by mouth 2 (two) times daily.      clopidogrel (PLAVIX) 75 MG tablet Take 1 tablet (75 mg total) by mouth daily. 90 tablet 3   Multiple Vitamin (MULTIVITAMIN) tablet Take 1 tablet by mouth daily.     omeprazole (PRILOSEC) 40 MG capsule Take 1 capsule by mouth daily.     pregabalin (LYRICA) 75 MG capsule Take 75 mg by mouth 3 (three) times daily as needed.     rosuvastatin (CRESTOR) 5 MG tablet Take 5 mg by mouth every other day.     No current facility-administered medications for this visit.     Past Surgical History:  Procedure Laterality Date   CHOLECYSTECTOMY  08/05/2016   ORIF PATELLA  05/08/2011   Procedure: OPEN REDUCTION INTERNAL (ORIF) FIXATION PATELLA;  Surgeon: Toni Arthurs, MD;  Location: [facility];  Service: Orthopedics;  Laterality: Right;   TUBAL LIGATION  1985     Allergies  Allergen Reactions   Aspirin    Atorvastatin Other (See Comments)  Muscle pain   Crestor [Rosuvastatin Calcium] Other (See Comments)    Muscle pain      Family History  Problem Relation Age of Onset   Ovarian cancer Mother    Heart attack Father      Social History Ms. Troop reports that she quit smoking about 10 years ago. Her smoking use included cigarettes. She has a 40.00 pack-year smoking history. She has never used smokeless tobacco. Ms. Shedlock reports no history of alcohol use.   Review of Systems CONSTITUTIONAL: No weight loss, fever, chills, weakness or fatigue.  HEENT: Eyes: No visual loss, blurred vision, double vision or yellow sclerae.No hearing loss, sneezing, congestion, runny nose or sore throat.  SKIN: No rash or itching.  CARDIOVASCULAR: per hpi RESPIRATORY: per hpi GASTROINTESTINAL: No  anorexia, nausea, vomiting or diarrhea. No abdominal pain or blood.  GENITOURINARY: No burning on urination, no polyuria NEUROLOGICAL: No headache, dizziness, syncope, paralysis, ataxia, numbness or tingling in the extremities. No change in bowel or bladder control.  MUSCULOSKELETAL: No muscle, back pain, joint pain or stiffness.  LYMPHATICS: No enlarged nodes. No history of splenectomy.  PSYCHIATRIC: No history of depression or anxiety.  ENDOCRINOLOGIC: No reports of sweating, cold or heat intolerance. No polyuria or polydipsia.  Marland Kitchen   Physical Examination Today's Vitals   09/12/21 1408  BP: 136/72  Pulse: (!) 111  SpO2: 92%  Weight: 179 lb (81.2 kg)  Height: 5\' 3"  (1.6 m)   Body mass index is 31.71 kg/m.  Gen: resting comfortably, no acute distress HEENT: no scleral icterus, pupils equal round and reactive, no palptable cervical adenopathy,  CV: RRR, 3/6 systolic murmur rusb, right carotid bruit Resp: Clear to auscultation bilaterally GI: abdomen is soft, non-tender, non-distended, normal bowel sounds, no hepatosplenomegaly MSK: extremities are warm, no edema.  Skin: warm, no rash Neuro:  no focal deficits Psych: appropriate affect   Diagnostic Studies     Assessment and Plan  Aortic stenosis - moderate to severe AS with majority of criteria supporting moderate. Low SVI suggesting lower than expected gradients are due to paradoxical low flow low gradient aortic stenosis - with ongoing DOE and chest pains would plan for repeat echo for close surveillance     2. Chest pain - recent chest pain and DOE - f/u echo, pending aortic valve consider lexiscan   3. Carotid stenosis -repeat study shows left sided now occluded, right sided mild disease - no neurological symptoms - will have her establish with vascular surgery.         , M.D.

## 2021-09-12 NOTE — Patient Instructions (Signed)
Medication Instructions:  Continue all current medications.  Labwork: none  Testing/Procedures: Your physician has requested that you have an echocardiogram. Echocardiography is a painless test that uses sound waves to create images of your heart. It provides your doctor with information about the size and shape of your heart and how well your heart's chambers and valves are working. This procedure takes approximately one hour. There are no restrictions for this procedure. Office will contact with results via phone, letter or mychart.     Follow-Up: 6 months   Any Other Special Instructions Will Be Listed Below (If Applicable). You have been referred to Vascular    If you need a refill on your cardiac medications before your next appointment, please call your pharmacy.

## 2021-09-14 ENCOUNTER — Ambulatory Visit (INDEPENDENT_AMBULATORY_CARE_PROVIDER_SITE_OTHER): Payer: Medicare Other

## 2021-09-14 DIAGNOSIS — I350 Nonrheumatic aortic (valve) stenosis: Secondary | ICD-10-CM | POA: Diagnosis not present

## 2021-09-14 LAB — ECHOCARDIOGRAM COMPLETE
AR max vel: 1 cm2
AV Area VTI: 1.06 cm2
AV Area mean vel: 1 cm2
AV Mean grad: 21 mmHg
AV Peak grad: 30.6 mmHg
Ao pk vel: 2.77 m/s
Area-P 1/2: 3.39 cm2
Calc EF: 74.2 %
S' Lateral: 1.92 cm
Single Plane A2C EF: 76.8 %
Single Plane A4C EF: 70.5 %

## 2021-10-05 ENCOUNTER — Telehealth: Payer: Self-pay | Admitting: *Deleted

## 2021-10-05 NOTE — Telephone Encounter (Signed)
Lesle Chris, LPN  02/04/4713  9:53 PM EDT Back to Top    Notified, copy to pcp.  States that she prefers to hold off on stress test for now.  A lot going on right now as well as transportation issues.  She will call back if changes her mind.

## 2021-10-05 NOTE — Telephone Encounter (Signed)
-----   Message from Antoine Poche, MD sent at 10/03/2021  9:59 AM EDT ----- Heart valve is in the moderate range as far as stiffness, typically at this range does not cause symptoms. Please order a lexiscan to look for any potential artery blockages that could be playing a role in her symptoms   Dominga Ferry MD

## 2021-10-10 ENCOUNTER — Ambulatory Visit: Payer: Medicare Other

## 2021-10-10 DIAGNOSIS — R0683 Snoring: Secondary | ICD-10-CM

## 2021-10-10 DIAGNOSIS — G4733 Obstructive sleep apnea (adult) (pediatric): Secondary | ICD-10-CM | POA: Diagnosis not present

## 2021-10-11 ENCOUNTER — Telehealth: Payer: Self-pay | Admitting: Pulmonary Disease

## 2021-10-11 DIAGNOSIS — G4733 Obstructive sleep apnea (adult) (pediatric): Secondary | ICD-10-CM | POA: Diagnosis not present

## 2021-10-11 NOTE — Telephone Encounter (Signed)
HST 10/10/21 >> AHI 12.9, SpO2 low 67%  Please inform her that her sleep study shows mild obstructive sleep apnea.  Please arrange for ROV with me or NP to discuss treatment options.

## 2021-10-12 NOTE — Telephone Encounter (Signed)
I called the patient and she has a follow up to go over the results in more detail. Nothing further needed.

## 2021-10-31 ENCOUNTER — Ambulatory Visit (INDEPENDENT_AMBULATORY_CARE_PROVIDER_SITE_OTHER): Payer: Medicare Other | Admitting: Pulmonary Disease

## 2021-10-31 ENCOUNTER — Encounter: Payer: Self-pay | Admitting: Pulmonary Disease

## 2021-10-31 VITALS — BP 130/84 | HR 112 | Temp 97.7°F | Ht 63.0 in | Wt 175.2 lb

## 2021-10-31 DIAGNOSIS — R911 Solitary pulmonary nodule: Secondary | ICD-10-CM

## 2021-10-31 DIAGNOSIS — G4733 Obstructive sleep apnea (adult) (pediatric): Secondary | ICD-10-CM | POA: Diagnosis not present

## 2021-10-31 DIAGNOSIS — J439 Emphysema, unspecified: Secondary | ICD-10-CM | POA: Diagnosis not present

## 2021-10-31 MED ORDER — AZELASTINE HCL 0.15 % NA SOLN: 1.0000 | Freq: Every day | NASAL | Status: AC | PRN

## 2021-10-31 NOTE — Progress Notes (Signed)
[facility]  Chief Complaint  Patient presents with   Follow-up    Appt to review HST results     Past Surgical History:  She  has a past surgical history that includes Tubal ligation (1985); ORIF patella (05/08/2011); and Cholecystectomy (08/05/2016).  Past Medical History:  Pneumonia, Hiatal hernia, GERD, Neuropathy, Aortic stenosis, HLD  Constitutional:  BP 130/84 (BP Location: Left Arm)   Pulse (!) 112   Temp 97.7 F (36.5 C) (Temporal)   Ht 5\' 3"  (1.6 m)   Wt 175 lb 3.2 oz (79.5 kg)   SpO2 96% Comment: ra  BMI 31.04 kg/m   Brief Summary:  [name] is a 71 y.o. female former smoker with dyspnea.      Subjective:   Home sleep study showed mild sleep apnea.  CT chest from July showed persistent emphysema and lung nodule now 7 mm.    She was found to have psoriatic arthritis and has upcoming appointment with rheumatology.  Physical Exam:   Appearance - well kempt   ENMT - no sinus tenderness, no oral exudate, no LAN, Mallampati 3 airway, no stridor  Respiratory - equal breath sounds bilaterally, no wheezing or rales  CV - s1s2 regular rate and rhythm, 3/6 SM  Ext - no clubbing, no edema  Skin - no rashes  Psych - normal mood and affect     Pulmonary testing:    Chest Imaging:  CT chest 11/04/17 >> atherosclerosis, small hiatal hernia, mild emphysema LDCT chest 05/01/21 >> moderate centrilobular and paraseptal emphysema, 21.3 mm nodule LLL with central cavitation, surrounding tree in bud CT chest 08/15/21 >> mild/mod centrilobular and paraseptal emphysema, decreased size of LLL nodule now 7 mm  Sleep Tests:  HST 10/10/21 >> AHI 12.9, SpO2 low 67%  Cardiac Tests:  Echo 09/14/21 >> EF 70 to 75%, grade 1 DD, mod AS  Social History:  She  reports that she quit smoking about 10 years ago. Her smoking use included cigarettes. She has a 40.00 pack-year smoking history. She has never used smokeless  tobacco. She reports that she does not drink alcohol and does not use drugs.  Family History:  Her family history includes Heart attack in her father; Ovarian cancer in her mother.     Assessment/Plan:   Lung nodule Lt lower lobe. - will arrange for follow up CT chest without contrast in January 2024  Moderate to severe non-rheumatic aortic stenosis, Grade 1 diastolic dysfunction, coronary atherosclerosis. - followed by Dr. February 2024 with cardiology  History of tobacco abuse, changes of emphysema on previous CT chest, and possible COPD. - no improvement with previous trial of stiolto; hold off additional inhaler therapy for now - will arrange for pulmonary function test  Obstructive sleep apnea. - reviewed her sleep study - discussed how sleep apnea can impact her health - treatment options discussed - will arrange for auto CPAP set up  Sinus congestion. - breath rite strips - prn astepro - continue flonase at night   Time Spent Involved in Patient Care on Day of Examination:  39 minutes  Follow up:   Patient Instructions  Astepro 1 spray in each nostril in the morning as needed for allergies  Try using breath rite nasal strips at night  Will arrange for pulmonary function test at [facility]  Will arrange for auto CPAP set up  Follow up in January 2024 after you complete your follow up CT chest scan  Medication List:   Allergies as of 10/31/2021       Reactions   Aspirin    Atorvastatin Other (See Comments)   Muscle pain   Crestor [rosuvastatin Calcium] Other (See Comments)   Muscle pain        Medication List        Accurate as of October 31, 2021 10:14 AM. If you have any questions, ask your nurse or doctor.          acetaminophen 650 MG CR tablet Commonly known as: TYLENOL Take 650 mg by mouth every 8 (eight) hours as needed for pain.   albuterol 108 (90 Base) MCG/ACT inhaler Commonly known as: VENTOLIN HFA Inhale 1-2 puffs  into the lungs every 4 (four) hours as needed.   Azelastine HCl 0.15 % Soln Commonly known as: Astepro Place 1 spray into the nose daily as needed. Started by: Chesley Mires, MD   cetirizine 10 MG tablet Commonly known as: ZYRTEC Take 10 mg by mouth 2 (two) times daily.   clopidogrel 75 MG tablet Commonly known as: PLAVIX Take 1 tablet (75 mg total) by mouth daily.   multivitamin tablet Take 1 tablet by mouth daily.   omeprazole 40 MG capsule Commonly known as: PRILOSEC Take 1 capsule by mouth daily.   pregabalin 75 MG capsule Commonly known as: LYRICA Take 75 mg by mouth 3 (three) times daily as needed.   rosuvastatin 5 MG tablet Commonly known as: CRESTOR Take 5 mg by mouth every other day.        Signature:  Chesley Mires, MD [place] Pager - [phone] 10/31/2021, 10:14 AM

## 2021-10-31 NOTE — Patient Instructions (Signed)
Astepro 1 spray in each nostril in the morning as needed for allergies  Try using breath rite nasal strips at night  Will arrange for pulmonary function test at [facility]  Will arrange for auto CPAP set up  Follow up in January 2024 after you complete your follow up CT chest scan

## 2021-11-09 ENCOUNTER — Ambulatory Visit (INDEPENDENT_AMBULATORY_CARE_PROVIDER_SITE_OTHER): Payer: Medicare Other

## 2021-11-09 ENCOUNTER — Ambulatory Visit: Payer: Medicare Other | Attending: Internal Medicine | Admitting: Internal Medicine

## 2021-11-09 ENCOUNTER — Encounter: Payer: Self-pay | Admitting: Internal Medicine

## 2021-11-09 VITALS — BP 152/78 | HR 106 | Resp 16 | Ht 62.0 in | Wt 176.0 lb

## 2021-11-09 DIAGNOSIS — M25512 Pain in left shoulder: Secondary | ICD-10-CM

## 2021-11-09 DIAGNOSIS — R768 Other specified abnormal immunological findings in serum: Secondary | ICD-10-CM | POA: Diagnosis not present

## 2021-11-09 DIAGNOSIS — G8929 Other chronic pain: Secondary | ICD-10-CM | POA: Diagnosis not present

## 2021-11-09 DIAGNOSIS — Z79899 Other long term (current) drug therapy: Secondary | ICD-10-CM

## 2021-11-09 DIAGNOSIS — M25511 Pain in right shoulder: Secondary | ICD-10-CM | POA: Insufficient documentation

## 2021-11-09 DIAGNOSIS — M79641 Pain in right hand: Secondary | ICD-10-CM

## 2021-11-09 DIAGNOSIS — M79642 Pain in left hand: Secondary | ICD-10-CM

## 2021-11-09 DIAGNOSIS — L218 Other seborrheic dermatitis: Secondary | ICD-10-CM | POA: Diagnosis not present

## 2021-11-09 DIAGNOSIS — R7689 Other specified abnormal immunological findings in serum: Secondary | ICD-10-CM | POA: Insufficient documentation

## 2021-11-09 NOTE — Progress Notes (Signed)
Office Visit Note  Patient: [name]             Date of Birth: 1950/05/11           MRN: [id number]             PCP: Wanita Chamberlain, PA-C Referring: Curlene Labrum, MD Visit Date: 11/09/2021   Subjective:  New Patient (Initial Visit) (Left index finger issue. Bilateral shoulder pain. Loss of grip strength.)   History of Present Illness: [name] is a 71 y.o. female here foe evaluation of abnormal labs including positive dsDNA Abs associated with multiple joint pains. Symptoms have been ongoing for years initially had more severe problem but is less so now than in the past.  Typically she has only mild symptoms but the joint pain and stiffness most impressively when she becomes unable to raise her shoulders above horizontal.  She is experienced multiple exacerbations of the symptoms.  She reports originally told she had psoriatic arthritis by her provider in [place] over 2 decades ago.  She takes Tylenol arthritis as needed for joint pains and is on the pregabalin.  She experiences numbness in the tips of the fingers most commonly affecting the first 3 fingers in her hands.  She also gets some numbness in the tips of her feet and toes.  This some extends to about the level of the ankle.  She previously was tried on gabapentin for concern of peripheral neuropathy symptoms and had a fall on this medication so discontinued it.  She does not notice much visible joint swelling redness or warmth in affected areas.   08/15/21 CT Chest without contrast IMPRESSION: 1. Nodule of the left lower lobe is decreased in size when compared with prior exam, compatible with resolving infectious process. Recommend additional six-month follow-up to ensure complete resolution. Lung-RADS 3, probably benign findings. Short-term follow-up in 6 months is recommended with repeat low-dose chest CT without contrast (please use the following order, "CT CHEST LCS NODULE FOLLOW-UP W/O CM"). 2.  Three-vessel coronary artery calcifications and aortic valve calcifications. 3. Aortic Atherosclerosis (ICD10-I70.0) and Emphysema (ICD10-J43.9).  Activities of Daily Living:  Patient reports morning stiffness for 1 hour.   Patient Reports nocturnal pain.  Difficulty dressing/grooming: Denies Difficulty climbing stairs: Reports Difficulty getting out of chair: Denies Difficulty using hands for taps, buttons, cutlery, and/or writing: Denies  Review of Systems  Constitutional:  Positive for fatigue.  HENT:  Negative for mouth sores and mouth dryness.   Eyes:  Negative for dryness.  Respiratory:  Positive for shortness of breath.   Cardiovascular:  Positive for chest pain.  Gastrointestinal:  Positive for diarrhea. Negative for blood in stool and constipation.  Endocrine: Negative for increased urination.  Genitourinary:  Negative for involuntary urination.  Musculoskeletal:  Positive for joint pain, gait problem, joint pain, joint swelling and morning stiffness. Negative for myalgias, muscle weakness, muscle tenderness and myalgias.  Skin:  Positive for rash, hair loss and sensitivity to sunlight. Negative for color change.  Allergic/Immunologic: Negative for susceptible to infections.  Neurological:  Negative for dizziness and headaches.  Hematological:  Negative for swollen glands.  Psychiatric/Behavioral:  Positive for sleep disturbance. Negative for depressed mood. The patient is not nervous/anxious.     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/08/2018   Coronary atherosclerosis 12/04/2017   Pain in joint, pelvic  region and thigh 12/04/2017   Diaphragmatic hernia 11/07/2017   Other specified chronic obstructive airways disease 11/07/2017   Intestinal infection due to campylobacter 03/25/2017   Diarrhea 03/22/2017   Contact dermatitis and other eczema 09/12/2016   Other gallbladder disorder 07/17/2016   Abdominal pain, epigastric 07/09/2016   Esophageal reflux 07/09/2016   Acute suppurative otitis media without spontaneous rupture of ear drum 01/15/2016   Other seborrheic dermatitis 01/15/2016   Anemia 03/20/2015   Other malaise and fatigue 03/20/2015   Encounter for general adult medical examination without abnormal findings 12/29/2014   Dermatophytosis of foot 12/28/2014   Other specified disease of nail 12/28/2014   Otitis media 12/28/2014    Past Medical History:  Diagnosis Date   Aortic stenosis    Carotid artery calcification, bilateral    GERD (gastroesophageal reflux disease)    Hiatal hernia    Hyperlipidemia    Neuropathy    Pneumonia approx. 2006    Family History  Problem Relation Age of Onset   Diabetes Mother    Ovarian cancer Mother    Heart attack Father    Lung cancer Brother    Healthy Brother    Healthy Son    Healthy Son    Past Surgical History:  Procedure Laterality Date   CHOLECYSTECTOMY  08/05/2016   ORIF PATELLA  05/08/2011   Procedure: OPEN REDUCTION INTERNAL (ORIF) FIXATION PATELLA;  Surgeon: Wylene Simmer, MD;  Location: [facility];  Service: Orthopedics;  Laterality: Right;   TUBAL LIGATION  1985   Social History   Social History Narrative   Not on file   Immunization History  Administered Date(s) Administered   Td (Adult),5 Lf Tetanus Toxid, Preservative Free 05/08/1995   Tdap 05/08/1995, 05/08/2011     Objective: Vital Signs: BP (!) 152/78 (BP Location: Right Arm, Patient Position: Sitting, Cuff Size: Normal)   Pulse (!) 106   Resp 16   Ht 5\' 2"  (1.575 m)   Wt 176 lb (79.8 kg)   BMI 32.19 kg/m    Physical Exam Constitutional:       Appearance: She is obese.  Cardiovascular:     Rate and Rhythm: Normal rate and regular rhythm.     Heart sounds: Murmur heard.  Pulmonary:     Effort: Pulmonary effort is normal.     Breath sounds: Normal breath sounds.  Musculoskeletal:     Right lower leg: No edema.     Left lower leg: No edema.  Skin:    Comments: Mild tortuous veins of distal legs, small medial ankle rash with telangiectasias Seborrheic dermatitis spots on forearm and mid back Normal nailfold capillaries and no digital pitting  Neurological:     Mental Status: She is alert.     Comments: Sensation over feet and distal portion of shins bilaterally, strength 5/5 distally      Musculoskeletal Exam:  Neck full ROM no tenderness Shoulders unable to actively abduct overhead, passive ROM intact with pain, tenderness to pressure over muscles proximal to shoulders, also some posterior or lateral pain Elbows full ROM no tenderness or swelling Wrists full ROM no tenderness or swelling Fingers full ROM no tenderness or swelling, mild ulnar deviation of fingers both hands, 2nd MCP joint enlargement without palpable synovitis Knees  full ROM no tenderness or swelling Ankles full ROM no tenderness or swelling  Limited US exam of bilateral wrists with normal appearance of median nerve  Investigation: No additional findings.  Imaging: No results found.  Recent Labs: Lab Results  Component Value Date   WBC 8.6 11/09/2021   HGB 11.9 11/09/2021   PLT 189 11/09/2021   NA 140 11/09/2021   K 4.5 11/09/2021   CL 103 11/09/2021   CO2 22 11/09/2021   GLUCOSE 86 11/09/2021   BUN 21 11/09/2021   CREATININE 0.89 11/09/2021   BILITOT 0.3 11/09/2021   AST 26 11/09/2021   ALT 22 11/09/2021   PROT 8.2 (H) 11/09/2021   CALCIUM 9.5 11/09/2021   GFRAA >90 05/09/2011    Speciality Comments: No specialty comments available.  Procedures:  No procedures performed Allergies: Aspirin, Atorvastatin, and Crestor [rosuvastatin  calcium]   Assessment / Plan:     Visit Diagnoses: Positive double stranded DNA antibody test - Plan: ANA, Anti-scleroderma antibody, RNP Antibody, Anti-Smith antibody, Sjogrens syndrome-A extractable nuclear antibody, Anti-DNA antibody, double-stranded, C3 and C4, Sedimentation rate, C-reactive protein  Positive double-stranded DNA antibodies though does not have much clinical criteria for systemic lupus.  We will repeat level as well as additional specific antibody panel today.  Also checking sed rate CRP and serum complements for evidence of immune activity.  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  Chronic pain of both shoulders - Plan: XR Shoulder Right, XR Shoulder Left, Rheumatoid factor  X-ray of bilateral shoulders does not show very significant osteoarthritis.  I am more suspicious for possible frozen shoulder or bursitis swelling limiting her range of motion.  If this stays restricted could also be a good candidate of trial for subacromial bursa steroid injection.  Bilateral hand pain - Plan: XR Hand 2 View Right, XR Hand 2 View Left, Rheumatoid factor  X-ray of bilateral hands does look suspicious for erosive arthritis particularly with the head of the third metacarpal on her left hand.  Could also be abnormality associated with the evident remote fracture and healing in his hand.  Otherwise there is generalized osteopenia and some osteoarthritis as well.  No chondrocalcinosis to suggest CPPD.  Other seborrheic dermatitis  Current skin rashes I do not see evidence of active psoriatic disease.  High risk medication use - Plan: CBC with Differential/Platelet, COMPLETE METABOLIC PANEL WITH GFR  Checking CBC and CMP in anticipation of possible DMARD treatment since she has peripheral arthritis particularly affecting shoulders and hands.  Methotrexate could be an option though with  caution considering existing neuropathy symptoms.  May also consider hydroxychloroquine based on her seropositivity.  Orders: Orders Placed This Encounter  Procedures   XR Hand 2 View Right   XR Hand 2 View Left   XR Shoulder Right   XR Shoulder Left   ANA   Anti-scleroderma antibody   RNP Antibody   Anti-Smith antibody   Sjogrens syndrome-A extractable nuclear antibody   Anti-DNA antibody, double-stranded   C3 and C4   Sedimentation rate   C-reactive protein   Rheumatoid factor   CBC with Differential/Platelet   COMPLETE METABOLIC PANEL WITH GFR   Anti-nuclear ab-titer (ANA titer)   No orders of the defined types were placed in this encounter.    Follow-Up Instructions: Return in about 3 weeks (around 11/30/2021) for New pt +dsDNA/shoulders/neuropathy f/u 3wks.   Collier Salina, MD  Note - This  record has been created using Dragon software.  Chart creation errors have been sought, but may not always  have been located. Such creation errors do not reflect on  the standard of medical care.  

## 2021-11-11 LAB — RHEUMATOID FACTOR: Rheumatoid fact SerPl-aCnc: <14 IU/mL (ref <14)

## 2021-11-11 LAB — CBC WITH DIFFERENTIAL/PLATELET
Absolute Monocytes: 576 cells/uL (ref 200–950)
Basophils Absolute: 43 cells/uL (ref 0–200)
Basophils Relative: 0.5 %
Eosinophils Absolute: 129 cells/uL (ref 15–500)
Eosinophils Relative: 1.5 %
HCT: 35.9 % (ref 35.0–45.0)
Hemoglobin: 11.9 g/dL (ref 11.7–15.5)
Lymphs Abs: 3113 cells/uL (ref 850–3900)
MCH: 27 pg (ref 27.0–33.0)
MCHC: 33.1 g/dL (ref 32.0–36.0)
MCV: 81.4 fL (ref 80.0–100.0)
MPV: 10.9 fL (ref 7.5–12.5)
Monocytes Relative: 6.7 %
Neutro Abs: 4739 cells/uL (ref 1500–7800)
Neutrophils Relative %: 55.1 %
Platelets: 189 10*3/uL (ref 140–400)
RBC: 4.41 10*6/uL (ref 3.80–5.10)
RDW: 16 % — ABNORMAL HIGH (ref 11.0–15.0)
Total Lymphocyte: 36.2 %
WBC: 8.6 10*3/uL (ref 3.8–10.8)

## 2021-11-11 LAB — COMPLETE METABOLIC PANEL WITH GFR
AG Ratio: 1.5 (calc) (ref 1.0–2.5)
ALT: 22 U/L (ref 6–29)
AST: 26 U/L (ref 10–35)
Albumin: 4.9 g/dL (ref 3.6–5.1)
Alkaline phosphatase (APISO): 81 U/L (ref 37–153)
BUN: 21 mg/dL (ref 7–25)
CO2: 22 mmol/L (ref 20–32)
Calcium: 9.5 mg/dL (ref 8.6–10.4)
Chloride: 103 mmol/L (ref 98–110)
Creat: 0.89 mg/dL (ref 0.60–1.00)
Globulin: 3.3 g/dL (calc) (ref 1.9–3.7)
Glucose, Bld: 86 mg/dL (ref 65–99)
Potassium: 4.5 mmol/L (ref 3.5–5.3)
Sodium: 140 mmol/L (ref 135–146)
Total Bilirubin: 0.3 mg/dL (ref 0.2–1.2)
Total Protein: 8.2 g/dL — ABNORMAL HIGH (ref 6.1–8.1)
eGFR: 69 mL/min/{1.73_m2} (ref >=60)

## 2021-11-11 LAB — ANTI-DNA ANTIBODY, DOUBLE-STRANDED: ds DNA Ab: 47 IU/mL — ABNORMAL HIGH

## 2021-11-11 LAB — C3 AND C4
C3 Complement: 174 mg/dL (ref 83–193)
C4 Complement: 29 mg/dL (ref 15–57)

## 2021-11-11 LAB — C-REACTIVE PROTEIN: CRP: 19 mg/L — ABNORMAL HIGH (ref <8.0)

## 2021-11-11 LAB — ANTI-NUCLEAR AB-TITER (ANA TITER): ANA Titer 1: 1:80 {titer} — ABNORMAL HIGH

## 2021-11-11 LAB — SJOGRENS SYNDROME-A EXTRACTABLE NUCLEAR ANTIBODY: SSA (Ro) (ENA) Antibody, IgG: <1 AI

## 2021-11-11 LAB — SEDIMENTATION RATE: Sed Rate: 25 mm/h (ref 0–30)

## 2021-11-11 LAB — ANA: Anti Nuclear Antibody (ANA): POSITIVE — AB

## 2021-11-11 LAB — RNP ANTIBODY: Ribonucleic Protein(ENA) Antibody, IgG: <1 AI

## 2021-11-11 LAB — ANTI-SMITH ANTIBODY: ENA SM Ab Ser-aCnc: <1 AI

## 2021-11-11 LAB — ANTI-SCLERODERMA ANTIBODY: Scleroderma (Scl-70) (ENA) Antibody, IgG: <1 AI

## 2021-12-04 ENCOUNTER — Telehealth: Payer: Self-pay | Admitting: Internal Medicine

## 2021-12-04 NOTE — Telephone Encounter (Signed)
Patient left a voicemail stating she had an appointment on 11/07/21 and had labwork and x-rays.  Patient requested a return call to discuss the results.

## 2021-12-13 NOTE — Telephone Encounter (Signed)
Patient called the office requesting a call back regarding her lab results and x-rays results. Patient states she is still having trouble with her left arm. Having trouble raising it. Patient states she feels like something "is sticking her". Please advise.

## 2021-12-15 NOTE — Telephone Encounter (Signed)
Patient requesting return call °

## 2021-12-15 NOTE — Telephone Encounter (Signed)
Please contact the patient about scheduling a follow-up appointment so we can discuss medication options also possible trial of intra-articular steroid injection for limited shoulder mobility.  FYI- I spoke with Ms. Pita I discussed her x-rays do look suggestive for erosive disease in the hand that would be consistent with inflammatory arthritis and shoulder x-rays do not show a lot of osteoarthritis so there is probably some inflammation or frozen shoulder problem.  Lab results are positive for double-stranded DNA antibody and C-reactive protein.  I think this could be consistent with psoriatic arthritis I do not know that she really has lupus despite the low positive double-stranded DNA.  Alternative possibility would be a problem like pseudogout.

## 2021-12-18 NOTE — Telephone Encounter (Signed)
Unable to reach patient to schedule follow-up appointment.  Cell # - voicemail not set up Home - no answering machine.

## 2022-02-06 ENCOUNTER — Ambulatory Visit (HOSPITAL_COMMUNITY): Payer: 59

## 2022-02-09 ENCOUNTER — Ambulatory Visit: Payer: Medicare Other | Admitting: Pulmonary Disease

## 2022-02-23 DIAGNOSIS — G4733 Obstructive sleep apnea (adult) (pediatric): Secondary | ICD-10-CM | POA: Diagnosis not present

## 2022-03-09 DIAGNOSIS — I82412 Acute embolism and thrombosis of left femoral vein: Secondary | ICD-10-CM | POA: Diagnosis not present

## 2022-03-09 DIAGNOSIS — Z136 Encounter for screening for cardiovascular disorders: Secondary | ICD-10-CM | POA: Diagnosis not present

## 2022-03-09 DIAGNOSIS — K219 Gastro-esophageal reflux disease without esophagitis: Secondary | ICD-10-CM | POA: Diagnosis not present

## 2022-03-09 DIAGNOSIS — Z2821 Immunization not carried out because of patient refusal: Secondary | ICD-10-CM | POA: Diagnosis not present

## 2022-03-09 DIAGNOSIS — R413 Other amnesia: Secondary | ICD-10-CM | POA: Diagnosis not present

## 2022-03-09 DIAGNOSIS — Z1383 Encounter for screening for respiratory disorder NEC: Secondary | ICD-10-CM | POA: Diagnosis not present

## 2022-03-09 DIAGNOSIS — Z7901 Long term (current) use of anticoagulants: Secondary | ICD-10-CM | POA: Diagnosis not present

## 2022-03-09 DIAGNOSIS — M810 Age-related osteoporosis without current pathological fracture: Secondary | ICD-10-CM | POA: Diagnosis not present

## 2022-03-09 DIAGNOSIS — Z23 Encounter for immunization: Secondary | ICD-10-CM | POA: Diagnosis not present

## 2022-03-22 ENCOUNTER — Ambulatory Visit: Payer: 59 | Attending: Cardiology | Admitting: Cardiology

## 2022-03-22 ENCOUNTER — Encounter: Payer: Self-pay | Admitting: Cardiology

## 2022-03-22 VITALS — BP 126/68 | HR 103 | Ht 63.0 in | Wt 184.2 lb

## 2022-03-22 DIAGNOSIS — I350 Nonrheumatic aortic (valve) stenosis: Secondary | ICD-10-CM

## 2022-03-22 DIAGNOSIS — I6523 Occlusion and stenosis of bilateral carotid arteries: Secondary | ICD-10-CM | POA: Diagnosis not present

## 2022-03-22 DIAGNOSIS — R0789 Other chest pain: Secondary | ICD-10-CM

## 2022-03-22 DIAGNOSIS — R0602 Shortness of breath: Secondary | ICD-10-CM

## 2022-03-22 MED ORDER — PRAVASTATIN SODIUM 20 MG PO TABS: 20.0000 mg | ORAL_TABLET | Freq: Every day | ORAL | 6 refills | Status: DC

## 2022-03-22 MED ORDER — NITROGLYCERIN 0.4 MG SL SUBL: 0.4000 mg | SUBLINGUAL_TABLET | SUBLINGUAL | 3 refills | Status: AC | PRN

## 2022-03-22 NOTE — Patient Instructions (Addendum)
Medication Instructions:  Stop Crestor (Rosuvastatin) Begin Pravastatin 48m daily  Begin Nitroglycerin 0.455mas needed for chest pain  Continue all other medications.     Labwork: none  Testing/Procedures: Your physician has recommended that you have a pulmonary function test. Pulmonary Function Tests are a group of tests that measure how well air moves in and out of your lungs.  Office will contact with results via phone, letter or mychart.     Follow-Up: 6 months   Any Other Special Instructions Will Be Listed Below (If Applicable).   If you need a refill on your cardiac medications before your next appointment, please call your pharmacy.

## 2022-03-22 NOTE — Progress Notes (Signed)
Clinical Summary [name] is a 72 y.o.female seen today for follow up of the following medical problems.   1.History of chest pain - underwent a normal nuclear stress test on 10/31/2018. - Echocardiogram on 11/26/2018 demonstrated normal LV systolic function, EF 65 to [id number], grade 1 diastolic dysfunction, and mild to moderate aortic stenosis.   -chronic chest pains - ongoing chest pains. Right neck and shoulder, lasted about 15 minutes. Isolated episode.  - nonspecific tightness midchest   2. COPD - followed by pulmonary - noted on CT scan, does not appear she has had PFTs   3. Aortic stenosis - 10/2018 echo: LVEF 65-70%, AV mean grad 13, AVA VTI 1.15, DI 0.45, SVI 37 - 03/2021 echo [facility]: LVEF >70%, grade I dd, mod to severe AS mean grad 17, AVA VTI 0.9, SVI 29, DI 0.36 - DOE with activities. DOE with sweeping, mopping. DOE walking room to room at home   -chronic stable SOB/DOE, chest pain. Unclear if valve related.    08/2021 echo: LVEF 70-75%, grade I dd, mod AS mean grad 21, AVA VTI 1.06, SVI 34, DI 0.42    4. Carotid stenosis [id number] RICA 123456, LICA [id number] [id number] carotid US: RICA 123456, LICA total occlusion Reports high bp on ASA, we started plavix as alternative   5. Lung nodule - followed by pulmonary - former smoker x 50 years. Can have some cough, wheezing. CT scan consistent with emphysema.      6.Psoriatic arthritis  7. SOB - 08/2021 echo: LVEF 70-75%, grade I dd, mod AS mean grad 21, AVA VTI 1.06, SVI 34, DI 0.42 - we had discussed lexiscan but patient did not want to pursue  -ongoing SOB/DOE. DOE just walking into the office.   8.Hyperlipidemia - 10/2021 TC 191 TG 258 HDL 42 LDL 105  Past Medical History:  Diagnosis Date   Aortic stenosis    Carotid artery calcification, bilateral    GERD (gastroesophageal reflux disease)    Hiatal hernia    Hyperlipidemia    Neuropathy    Pneumonia approx. 2006     Allergies  Allergen Reactions    Aspirin    Atorvastatin Other (See Comments)    Muscle pain   Crestor [Rosuvastatin Calcium] Other (See Comments)    Muscle pain     Current Outpatient Medications  Medication Sig Dispense Refill   acetaminophen (TYLENOL) 650 MG CR tablet Take 650 mg by mouth every 8 (eight) hours as needed for pain.     albuterol (VENTOLIN HFA) 108 (90 Base) MCG/ACT inhaler Inhale 1-2 puffs into the lungs every 4 (four) hours as needed.     Azelastine HCl (ASTEPRO) 0.15 % SOLN Place 1 spray into the nose daily as needed.     cetirizine (ZYRTEC) 10 MG tablet Take 10 mg by mouth 2 (two) times daily.  (Patient not taking: Reported on 11/09/2021)     clopidogrel (PLAVIX) 75 MG tablet Take 1 tablet (75 mg total) by mouth daily. 90 tablet 3   co-enzyme Q-10 30 MG capsule Take 200 mg by mouth daily.     Loratadine 10 MG CAPS Take by mouth.     Multiple Vitamin (MULTIVITAMIN) tablet Take 1 tablet by mouth daily.     omeprazole (PRILOSEC) 40 MG capsule Take 1 capsule by mouth daily.     pregabalin (LYRICA) 75 MG capsule Take 75 mg by mouth 3 (three) times daily as needed.     rosuvastatin (CRESTOR) 5 MG tablet  Take 5 mg by mouth every other day.     No current facility-administered medications for this visit.     Past Surgical History:  Procedure Laterality Date   CHOLECYSTECTOMY  08/05/2016   ORIF PATELLA  05/08/2011   Procedure: OPEN REDUCTION INTERNAL (ORIF) FIXATION PATELLA;  Surgeon: Wylene Simmer, MD;  Location: [facility];  Service: Orthopedics;  Laterality: Right;   TUBAL LIGATION  1985     Allergies  Allergen Reactions   Aspirin    Atorvastatin Other (See Comments)    Muscle pain   Crestor [Rosuvastatin Calcium] Other (See Comments)    Muscle pain      Family History  Problem Relation Age of Onset   Diabetes Mother    Ovarian cancer Mother    Heart attack Father    Lung cancer Brother    Healthy Brother    Healthy Son    Healthy Son      Social History Ms. Forgues reports that she  quit smoking about 11 years ago. Her smoking use included cigarettes. She has a 40.00 pack-year smoking history. She has been exposed to tobacco smoke. She has never used smokeless tobacco. Ms. Tassinari reports no history of alcohol use.   Review of Systems CONSTITUTIONAL: No weight loss, fever, chills, weakness or fatigue.  HEENT: Eyes: No visual loss, blurred vision, double vision or yellow sclerae.No hearing loss, sneezing, congestion, runny nose or sore throat.  SKIN: No rash or itching.  CARDIOVASCULAR: per hpi RESPIRATORY:per hpi GASTROINTESTINAL: No anorexia, nausea, vomiting or diarrhea. No abdominal pain or blood.  GENITOURINARY: No burning on urination, no polyuria NEUROLOGICAL: No headache, dizziness, syncope, paralysis, ataxia, numbness or tingling in the extremities. No change in bowel or bladder control.  MUSCULOSKELETAL: No muscle, back pain, joint pain or stiffness.  LYMPHATICS: No enlarged nodes. No history of splenectomy.  PSYCHIATRIC: No history of depression or anxiety.  ENDOCRINOLOGIC: No reports of sweating, cold or heat intolerance. No polyuria or polydipsia.  Marland Kitchen   Physical Examination Today's Vitals   03/22/22 1359  BP: 126/68  Pulse: (!) 103  SpO2: 94%  Weight: 184 lb 3.2 oz (83.6 kg)  Height: 5' 3"$  (1.6 m)   Body mass index is 32.63 kg/m.  Gen: resting comfortably, no acute distress HEENT: no scleral icterus, pupils equal round and reactive, no palptable cervical adenopathy,  CV: RRR, 3/6 systolic murmur rusb, no jvd Resp: Clear to auscultation bilaterally GI: abdomen is soft, non-tender, non-distended, normal bowel sounds, no hepatosplenomegaly MSK: extremities are warm, no edema.  Skin: warm, no rash Neuro:  no focal deficits Psych: appropriate affect   Diagnostic Studies  08/2021 echo  IMPRESSIONS     1. Left ventricular ejection fraction, by estimation, is 70 to 75%. The  left ventricle has hyperdynamic function. Left ventricular  endocardial  border not optimally defined to evaluate regional wall motion. Left  ventricular diastolic parameters are  consistent with Grade I diastolic dysfunction (impaired relaxation).  Elevated left atrial pressure.   2. Right ventricular systolic function is normal. The right ventricular  size is normal. Tricuspid regurgitation signal is inadequate for assessing  PA pressure.   3. The mitral valve is normal in structure. No evidence of mitral valve  regurgitation. No evidence of mitral stenosis.   4. The tricuspid valve is abnormal.   5. The aortic valve was not well visualized. There is moderate  calcification of the aortic valve. There is moderate thickening of the  aortic valve. Aortic valve regurgitation is not  visualized. Moderate  aortic valve stenosis. Aortic valve mean gradient  measures 21.0 mmHg. Aortic valve peak gradient measures 30.6 mmHg. Aortic  valve area, by VTI measures 1.06 cm.   6. The inferior vena cava is normal in size with greater than 50%  respiratory variability, suggesting right atrial pressure of 3 mmHg.   Assessment and Plan   Aortic stenosis - moderate AS by most recent echo - continue to monitor     2. Chest pain - chronic symptoms - nuclear stress in 2020 was benign - may consider repeat testing pending ongoing work for SOB.    3. Carotid stenosis -repeat study later this year - ASA allergy, she is on plavix  4. SOB - did not go for PFTs pulm ordered, we will reorder.   5.Hyperliidemia - above goal - only able to take crestor 50m qod - try changing to pravstatin 467mdaily     JoArnoldo LenisM.D.

## 2022-03-26 DIAGNOSIS — G4733 Obstructive sleep apnea (adult) (pediatric): Secondary | ICD-10-CM | POA: Diagnosis not present

## 2022-04-16 DIAGNOSIS — Z0001 Encounter for general adult medical examination with abnormal findings: Secondary | ICD-10-CM | POA: Diagnosis not present

## 2022-04-16 DIAGNOSIS — R030 Elevated blood-pressure reading, without diagnosis of hypertension: Secondary | ICD-10-CM | POA: Diagnosis not present

## 2022-04-16 DIAGNOSIS — J449 Chronic obstructive pulmonary disease, unspecified: Secondary | ICD-10-CM | POA: Diagnosis not present

## 2022-04-16 DIAGNOSIS — G4733 Obstructive sleep apnea (adult) (pediatric): Secondary | ICD-10-CM | POA: Diagnosis not present

## 2022-04-16 DIAGNOSIS — D649 Anemia, unspecified: Secondary | ICD-10-CM | POA: Diagnosis not present

## 2022-04-16 DIAGNOSIS — R011 Cardiac murmur, unspecified: Secondary | ICD-10-CM | POA: Diagnosis not present

## 2022-04-16 DIAGNOSIS — E785 Hyperlipidemia, unspecified: Secondary | ICD-10-CM | POA: Diagnosis not present

## 2022-04-16 DIAGNOSIS — I2510 Atherosclerotic heart disease of native coronary artery without angina pectoris: Secondary | ICD-10-CM | POA: Diagnosis not present

## 2022-04-24 DIAGNOSIS — G4733 Obstructive sleep apnea (adult) (pediatric): Secondary | ICD-10-CM | POA: Diagnosis not present

## 2022-05-22 ENCOUNTER — Ambulatory Visit (HOSPITAL_COMMUNITY)
Admission: RE | Admit: 2022-05-22 | Discharge: 2022-05-22 | Disposition: A | Discharge status: Home / Self Care | Payer: 59 | Source: Ambulatory Visit | Attending: Cardiology | Admitting: Cardiology

## 2022-05-22 DIAGNOSIS — R0602 Shortness of breath: Secondary | ICD-10-CM | POA: Diagnosis not present

## 2022-05-22 LAB — PULMONARY FUNCTION TEST
DL/VA % pred: 72 %
DL/VA: 3.05 ml/min/mmHg/L
DLCO unc % pred: 62 %
DLCO unc: 11.51 ml/min/mmHg
FEF 25-75 Post: 0.94 L/sec
FEF 25-75 Pre: 0.38 L/sec
FEF2575-%Change-Post: 147 %
FEF2575-%Pred-Post: 53 %
FEF2575-%Pred-Pre: 21 %
FEV1-%Change-Post: 29 %
FEV1-%Pred-Post: 70 %
FEV1-%Pred-Pre: 54 %
FEV1-Post: 1.49 L
FEV1-Pre: 1.15 L
FEV1FVC-%Change-Post: -6 %
FEV1FVC-%Pred-Pre: 70 %
FEV6-%Change-Post: 29 %
FEV6-%Pred-Post: 94 %
FEV6-%Pred-Pre: 72 %
FEV6-Post: 2.51 L
FEV6-Pre: 1.94 L
FEV6FVC-%Change-Post: -6 %
FEV6FVC-%Pred-Post: 87 %
FEV6FVC-%Pred-Pre: 93 %
FVC-%Change-Post: 38 %
FVC-%Pred-Post: 107 %
FVC-%Pred-Pre: 77 %
FVC-Post: 3.02 L
FVC-Pre: 2.17 L
Post FEV1/FVC ratio: 49 %
Post FEV6/FVC ratio: 83 %
Pre FEV1/FVC ratio: 53 %
Pre FEV6/FVC Ratio: 89 %

## 2022-05-22 MED ORDER — ALBUTEROL SULFATE (2.5 MG/3ML) 0.083% IN NEBU
2.5000 mg | INHALATION_SOLUTION | Freq: Once | RESPIRATORY_TRACT | Status: AC
  Administered 2022-05-22: 2.5 mg via RESPIRATORY_TRACT

## 2022-05-25 DIAGNOSIS — G4733 Obstructive sleep apnea (adult) (pediatric): Secondary | ICD-10-CM | POA: Diagnosis not present

## 2022-05-28 DIAGNOSIS — E785 Hyperlipidemia, unspecified: Secondary | ICD-10-CM | POA: Diagnosis not present

## 2022-05-28 DIAGNOSIS — R739 Hyperglycemia, unspecified: Secondary | ICD-10-CM | POA: Diagnosis not present

## 2022-06-01 ENCOUNTER — Telehealth: Payer: Self-pay | Admitting: *Deleted

## 2022-06-01 DIAGNOSIS — R942 Abnormal results of pulmonary function studies: Secondary | ICD-10-CM

## 2022-06-01 DIAGNOSIS — R0602 Shortness of breath: Secondary | ICD-10-CM

## 2022-06-01 NOTE — Telephone Encounter (Signed)
Lesle Chris, LPN 02/03/1094  0:45 PM EDT Back to Top    Notified, copy to pcp.  She is already established with Dr. Craige Cotta.     Lesle Chris, LPN 4/0/9811 91:47 AM EDT     Voice mailbox not set up.   Antoine Poche, MD 05/31/2022 12:45 PM EDT     Abnormal breathing tests suggesting some COPD, can we refer her to pulmonary for SOB.   Dominga Ferry MD

## 2022-06-01 NOTE — Telephone Encounter (Signed)
Will send request to Dr. Craige Cotta office for appointment.

## 2022-06-05 DIAGNOSIS — I350 Nonrheumatic aortic (valve) stenosis: Secondary | ICD-10-CM | POA: Diagnosis not present

## 2022-06-05 DIAGNOSIS — G5793 Unspecified mononeuropathy of bilateral lower limbs: Secondary | ICD-10-CM | POA: Diagnosis not present

## 2022-06-05 DIAGNOSIS — K219 Gastro-esophageal reflux disease without esophagitis: Secondary | ICD-10-CM | POA: Diagnosis not present

## 2022-06-05 DIAGNOSIS — R0602 Shortness of breath: Secondary | ICD-10-CM | POA: Diagnosis not present

## 2022-06-05 DIAGNOSIS — L4050 Arthropathic psoriasis, unspecified: Secondary | ICD-10-CM | POA: Diagnosis not present

## 2022-06-05 DIAGNOSIS — I6523 Occlusion and stenosis of bilateral carotid arteries: Secondary | ICD-10-CM | POA: Diagnosis not present

## 2022-06-05 DIAGNOSIS — Z23 Encounter for immunization: Secondary | ICD-10-CM | POA: Diagnosis not present

## 2022-06-05 DIAGNOSIS — E7849 Other hyperlipidemia: Secondary | ICD-10-CM | POA: Diagnosis not present

## 2022-06-05 DIAGNOSIS — R911 Solitary pulmonary nodule: Secondary | ICD-10-CM | POA: Diagnosis not present

## 2022-06-05 DIAGNOSIS — R030 Elevated blood-pressure reading, without diagnosis of hypertension: Secondary | ICD-10-CM | POA: Diagnosis not present

## 2022-06-05 DIAGNOSIS — I2510 Atherosclerotic heart disease of native coronary artery without angina pectoris: Secondary | ICD-10-CM | POA: Diagnosis not present

## 2022-06-05 DIAGNOSIS — J439 Emphysema, unspecified: Secondary | ICD-10-CM | POA: Diagnosis not present

## 2022-06-11 ENCOUNTER — Telehealth: Payer: Self-pay | Admitting: Pulmonary Disease

## 2022-06-11 DIAGNOSIS — G4733 Obstructive sleep apnea (adult) (pediatric): Secondary | ICD-10-CM

## 2022-06-11 NOTE — Telephone Encounter (Signed)
PT has a crack in her CPAP mask. Wonders how she can get another one. Please call @ [phone]

## 2022-06-11 NOTE — Telephone Encounter (Signed)
Another # is [phone]

## 2022-06-13 NOTE — Telephone Encounter (Signed)
Called and spoke w/ pt she verbalized that her mask currently is cracked. I have placed order for [facility] to provide her another mask. NFN att.

## 2022-06-16 ENCOUNTER — Other Ambulatory Visit: Payer: Self-pay | Admitting: Cardiology

## 2022-06-24 DIAGNOSIS — G4733 Obstructive sleep apnea (adult) (pediatric): Secondary | ICD-10-CM | POA: Diagnosis not present

## 2022-06-29 DIAGNOSIS — K219 Gastro-esophageal reflux disease without esophagitis: Secondary | ICD-10-CM | POA: Diagnosis not present

## 2022-06-29 DIAGNOSIS — J449 Chronic obstructive pulmonary disease, unspecified: Secondary | ICD-10-CM | POA: Diagnosis not present

## 2022-06-29 DIAGNOSIS — E785 Hyperlipidemia, unspecified: Secondary | ICD-10-CM | POA: Diagnosis not present

## 2022-07-11 DIAGNOSIS — Z136 Encounter for screening for cardiovascular disorders: Secondary | ICD-10-CM | POA: Diagnosis not present

## 2022-07-13 DIAGNOSIS — M810 Age-related osteoporosis without current pathological fracture: Secondary | ICD-10-CM | POA: Diagnosis not present

## 2022-07-13 DIAGNOSIS — G4762 Sleep related leg cramps: Secondary | ICD-10-CM | POA: Diagnosis not present

## 2022-07-13 DIAGNOSIS — K219 Gastro-esophageal reflux disease without esophagitis: Secondary | ICD-10-CM | POA: Diagnosis not present

## 2022-07-13 DIAGNOSIS — J302 Other seasonal allergic rhinitis: Secondary | ICD-10-CM | POA: Diagnosis not present

## 2022-07-13 DIAGNOSIS — G4733 Obstructive sleep apnea (adult) (pediatric): Secondary | ICD-10-CM | POA: Diagnosis not present

## 2022-07-13 DIAGNOSIS — Z136 Encounter for screening for cardiovascular disorders: Secondary | ICD-10-CM | POA: Diagnosis not present

## 2022-07-25 DIAGNOSIS — G4733 Obstructive sleep apnea (adult) (pediatric): Secondary | ICD-10-CM | POA: Diagnosis not present

## 2022-07-29 DIAGNOSIS — J449 Chronic obstructive pulmonary disease, unspecified: Secondary | ICD-10-CM | POA: Diagnosis not present

## 2022-07-29 DIAGNOSIS — K219 Gastro-esophageal reflux disease without esophagitis: Secondary | ICD-10-CM | POA: Diagnosis not present

## 2022-07-29 DIAGNOSIS — E785 Hyperlipidemia, unspecified: Secondary | ICD-10-CM | POA: Diagnosis not present

## 2022-08-07 ENCOUNTER — Ambulatory Visit (INDEPENDENT_AMBULATORY_CARE_PROVIDER_SITE_OTHER): Payer: 59 | Admitting: Pulmonary Disease

## 2022-08-07 ENCOUNTER — Encounter: Payer: Self-pay | Admitting: Pulmonary Disease

## 2022-08-07 VITALS — BP 115/65 | HR 109 | Ht 63.0 in | Wt 180.0 lb

## 2022-08-07 DIAGNOSIS — R911 Solitary pulmonary nodule: Secondary | ICD-10-CM | POA: Diagnosis not present

## 2022-08-07 DIAGNOSIS — J432 Centrilobular emphysema: Secondary | ICD-10-CM

## 2022-08-07 DIAGNOSIS — J4489 Other specified chronic obstructive pulmonary disease: Secondary | ICD-10-CM

## 2022-08-07 DIAGNOSIS — G4733 Obstructive sleep apnea (adult) (pediatric): Secondary | ICD-10-CM | POA: Diagnosis not present

## 2022-08-07 MED ORDER — FLUTICASONE FUROATE-VILANTEROL 100-25 MCG/ACT IN AEPB: 1.0000 | INHALATION_SPRAY | Freq: Every day | RESPIRATORY_TRACT | 5 refills | Status: DC

## 2022-08-07 NOTE — Progress Notes (Signed)
[facility]  Chief Complaint  Patient presents with   Follow-up    Past Surgical History:  She  has a past surgical history that includes Tubal ligation (1985); ORIF patella (05/08/2011); and Cholecystectomy (08/05/2016).  Past Medical History:  Pneumonia, Hiatal hernia, GERD, Neuropathy, Aortic stenosis, HLD  Constitutional:  BP 115/65   Pulse (!) 109   Ht 5\' 3"  (1.6 m)   Wt 180 lb (81.6 kg)   SpO2 94%   BMI 31.89 kg/m   Brief Summary:  [name] is a 72 y.o. female former smoker with COPD, Obstructive sleep apnea, and Lung nodule      Subjective:   PFT showed mild obstruction, and diffusion defect.  Also showed bronchodilator response.  She has cough with chest congestion.  She never heard about getting her CT chest scheduled.  She wants to wait until September to get this scheduled.  She has been using CPAP.  When she went to get a new mask they gave her a different brand and it doesn't fit right.  Physical Exam:   Appearance - well kempt   ENMT - no sinus tenderness, no oral exudate, no LAN, Mallampati 3 airway, no stridor  Respiratory - equal breath sounds bilaterally, no wheezing or rales  CV - s1s2 regular rate and rhythm, 3/6 SM  Ext - no clubbing, no edema  Skin - no rashes  Psych - normal mood and affect      Pulmonary testing:  PFT 05/22/22 >> FEV1 1.49 (70%), FEV1% 49, DLCO 62%, +BD  Chest Imaging:  CT chest 11/04/17 >> atherosclerosis, small hiatal hernia, mild emphysema LDCT chest 05/01/21 >> moderate centrilobular and paraseptal emphysema, 21.3 mm nodule LLL with central cavitation, surrounding tree in bud CT chest 08/15/21 >> mild/mod centrilobular and paraseptal emphysema, decreased size of LLL nodule now 7 mm  Sleep Tests:  HST 10/10/21 >> AHI 12.9, SpO2 low 67%  Cardiac Tests:  Echo 09/14/21 >> EF 70 to 75%, grade 1 DD, mod AS  Social History:  She  reports that she quit smoking  about 11 years ago. Her smoking use included cigarettes. She has a 40.00 pack-year smoking history. She has been exposed to tobacco smoke. She has never used smokeless tobacco. She reports that she does not drink alcohol and does not use drugs.  Family History:  Her family history includes Diabetes in her mother; Healthy in her brother, son, and son; Heart attack in her father; Lung cancer in her brother; Ovarian cancer in her mother.     Assessment/Plan:   Lung nodule Lt lower lobe. - she would like to wait until September 2024 to get follow up scan scheduled  COPD with asthma and emphysema. - stiolto didn't help - will have her try breo 100 one puff daily  Obstructive sleep apnea. - she is compliant with CPAP and reports benefit from therapy - she uses [facility] for her DME  - current CPAP ordered October 2023 - continue auto CPAP 5 to 15 cm H2O - will arrange for refitting of her CPAP mask  Moderate to severe non-rheumatic aortic stenosis, Grade 1 diastolic dysfunction, coronary atherosclerosis. - followed by Dr. Patrick Jupiter with cardiology  Sinus congestion. - breath rite strips - prn astepro - continue flonase at night  Time Spent Involved in Patient Care on Day of Examination:  36 minutes  Follow up:   Patient Instructions  Breo one puff daily, and rinse your mouth after each  use.  Will arrange for refitting of your CPAP mask.  Will schedule CT chest for September 2024.  Follow up in 3 months.  Medication List:   Allergies as of 08/07/2022       Reactions   Aspirin    Atorvastatin Other (See Comments)   Muscle pain   Crestor [rosuvastatin Calcium] Other (See Comments)   Muscle pain        Medication List        Accurate as of August 07, 2022  4:12 PM. If you have any questions, ask your nurse or doctor.          acetaminophen 650 MG CR tablet Commonly known as: TYLENOL Take 650 mg by mouth every 8 (eight) hours as needed for pain.    albuterol 108 (90 Base) MCG/ACT inhaler Commonly known as: VENTOLIN HFA Inhale 1-2 puffs into the lungs every 4 (four) hours as needed.   Azelastine HCl 0.15 % Soln Commonly known as: Astepro Place 1 spray into the nose daily as needed.   clopidogrel 75 MG tablet Commonly known as: PLAVIX TAKE ONE TABLET BY MOUTH ONCE daily   co-enzyme Q-10 30 MG capsule Take 200 mg by mouth daily.   diphenhydrAMINE 25 mg capsule Commonly known as: BENADRYL Take 25 mg by mouth at bedtime.   fluticasone furoate-vilanterol 100-25 MCG/ACT Aepb Commonly known as: Breo Ellipta Inhale 1 puff into the lungs daily. Started by: Coralyn Helling, MD   MISC NATURAL PRODUCTS PO Take 1 tablet by mouth daily.   multivitamin tablet Take 1 tablet by mouth daily.   nitroGLYCERIN 0.4 MG SL tablet Commonly known as: NITROSTAT Place 1 tablet (0.4 mg total) under the tongue every 5 (five) minutes as needed for chest pain.   omeprazole 40 MG capsule Commonly known as: PRILOSEC Take 1 capsule by mouth daily.   pravastatin 20 MG tablet Commonly known as: Pravachol Take 1 tablet (20 mg total) by mouth daily.   pregabalin 75 MG capsule Commonly known as: LYRICA Take 75 mg by mouth 2 (two) times daily as needed.        Signature:  Coralyn Helling, MD [facility]/Critical [facility] Pager - [phone] 08/07/2022, 4:12 PM

## 2022-08-07 NOTE — Patient Instructions (Signed)
Breo one puff daily, and rinse your mouth after each use.  Will arrange for refitting of your CPAP mask.  Will schedule CT chest for September 2024.  Follow up in 3 months.

## 2022-08-24 ENCOUNTER — Telehealth: Payer: Self-pay | Admitting: Cardiology

## 2022-08-24 DIAGNOSIS — G4733 Obstructive sleep apnea (adult) (pediatric): Secondary | ICD-10-CM | POA: Diagnosis not present

## 2022-08-24 NOTE — Telephone Encounter (Signed)
[employer] is faxing over a document for Dr.Branch to review about increasing patients pravastatin

## 2022-08-24 NOTE — Telephone Encounter (Signed)
Wynona Canes from [facility] is calling to verify that a fax was received at office in regards to an open care opportunity for statin medication. Requesting call back to verify.

## 2022-09-10 ENCOUNTER — Other Ambulatory Visit: Payer: Self-pay | Admitting: Cardiology

## 2022-09-20 ENCOUNTER — Ambulatory Visit: Payer: 59 | Admitting: Cardiology

## 2022-10-03 ENCOUNTER — Telehealth: Payer: Self-pay | Admitting: Pulmonary Disease

## 2022-10-03 NOTE — Telephone Encounter (Signed)
Patient has switched pharmacy and needs a new refill on her inhaler. Please send prescription to new pharmacy.  [facility] [phone] or [phone]

## 2022-10-04 ENCOUNTER — Other Ambulatory Visit: Payer: Self-pay

## 2022-10-04 MED ORDER — FLUTICASONE FUROATE-VILANTEROL 100-25 MCG/ACT IN AEPB: 1.0000 | INHALATION_SPRAY | Freq: Every day | RESPIRATORY_TRACT | 5 refills | Status: DC

## 2022-10-04 NOTE — Telephone Encounter (Signed)
Called and informed patient that we have changed her rx to [facility]

## 2022-10-09 ENCOUNTER — Ambulatory Visit (HOSPITAL_COMMUNITY)
Admission: RE | Admit: 2022-10-09 | Discharge: 2022-10-09 | Disposition: A | Discharge status: Home / Self Care | Payer: 59 | Source: Ambulatory Visit | Attending: Pulmonary Disease | Admitting: Pulmonary Disease

## 2022-10-09 DIAGNOSIS — I700 Atherosclerosis of aorta: Secondary | ICD-10-CM | POA: Diagnosis not present

## 2022-10-09 DIAGNOSIS — J432 Centrilobular emphysema: Secondary | ICD-10-CM | POA: Diagnosis not present

## 2022-10-09 DIAGNOSIS — R911 Solitary pulmonary nodule: Secondary | ICD-10-CM | POA: Diagnosis not present

## 2022-11-05 NOTE — Progress Notes (Signed)
Spoke with the pt and advised needs to schedule appt to review results. She states that she does not have her calendar available and will have to call back.

## 2022-11-08 ENCOUNTER — Encounter: Payer: Self-pay | Admitting: Pharmacist

## 2022-11-08 NOTE — Progress Notes (Signed)
Pharmacy Quality Measure Review  This patient is appearing on report for being at risk of failing the measure for Statin Therapy for Patients with Cardiovascular Disease [facility]) medications this calendar year.   Prior trials of: rosuvastatin 01/2022  Currently taking pravastatin 20mg  daily - consider low dose statin and due to patient having CVD recommended that she take moderate to high intensity statin.   Patient is actually a [facility] patient. She previously was seen by Ila Mcgill, PAC. Looks like her PCP is now [facility].  Patient was suppose to see cardiology in August but needed to cancel appt and has not been rescheduled yet.  Contact clinical pharmacist at [facility] - LM on VM regarding SPC gap.   Henrene Pastor, PharmD Clinical Pharmacist [facility]

## 2022-11-16 NOTE — Telephone Encounter (Signed)
Nothing further needed 

## 2022-11-29 DIAGNOSIS — R911 Solitary pulmonary nodule: Secondary | ICD-10-CM | POA: Diagnosis not present

## 2022-11-29 DIAGNOSIS — E039 Hypothyroidism, unspecified: Secondary | ICD-10-CM | POA: Diagnosis not present

## 2022-11-29 DIAGNOSIS — E785 Hyperlipidemia, unspecified: Secondary | ICD-10-CM | POA: Diagnosis not present

## 2022-12-07 DIAGNOSIS — J449 Chronic obstructive pulmonary disease, unspecified: Secondary | ICD-10-CM | POA: Diagnosis not present

## 2022-12-07 DIAGNOSIS — L4050 Arthropathic psoriasis, unspecified: Secondary | ICD-10-CM | POA: Diagnosis not present

## 2022-12-07 DIAGNOSIS — I6523 Occlusion and stenosis of bilateral carotid arteries: Secondary | ICD-10-CM | POA: Diagnosis not present

## 2022-12-07 DIAGNOSIS — G4733 Obstructive sleep apnea (adult) (pediatric): Secondary | ICD-10-CM | POA: Diagnosis not present

## 2022-12-07 DIAGNOSIS — R911 Solitary pulmonary nodule: Secondary | ICD-10-CM | POA: Diagnosis not present

## 2022-12-07 DIAGNOSIS — G5793 Unspecified mononeuropathy of bilateral lower limbs: Secondary | ICD-10-CM | POA: Diagnosis not present

## 2022-12-07 DIAGNOSIS — I350 Nonrheumatic aortic (valve) stenosis: Secondary | ICD-10-CM | POA: Diagnosis not present

## 2022-12-07 DIAGNOSIS — R739 Hyperglycemia, unspecified: Secondary | ICD-10-CM | POA: Diagnosis not present

## 2022-12-07 DIAGNOSIS — D649 Anemia, unspecified: Secondary | ICD-10-CM | POA: Diagnosis not present

## 2022-12-07 DIAGNOSIS — Z0001 Encounter for general adult medical examination with abnormal findings: Secondary | ICD-10-CM | POA: Diagnosis not present

## 2022-12-07 DIAGNOSIS — E7849 Other hyperlipidemia: Secondary | ICD-10-CM | POA: Diagnosis not present

## 2022-12-07 DIAGNOSIS — I2510 Atherosclerotic heart disease of native coronary artery without angina pectoris: Secondary | ICD-10-CM | POA: Diagnosis not present

## 2023-01-29 DIAGNOSIS — J449 Chronic obstructive pulmonary disease, unspecified: Secondary | ICD-10-CM | POA: Diagnosis not present

## 2023-01-29 DIAGNOSIS — I2510 Atherosclerotic heart disease of native coronary artery without angina pectoris: Secondary | ICD-10-CM | POA: Diagnosis not present

## 2023-05-30 DIAGNOSIS — E785 Hyperlipidemia, unspecified: Secondary | ICD-10-CM | POA: Diagnosis not present

## 2023-05-30 DIAGNOSIS — R739 Hyperglycemia, unspecified: Secondary | ICD-10-CM | POA: Diagnosis not present

## 2023-05-30 DIAGNOSIS — R5383 Other fatigue: Secondary | ICD-10-CM | POA: Diagnosis not present

## 2023-05-30 DIAGNOSIS — D649 Anemia, unspecified: Secondary | ICD-10-CM | POA: Diagnosis not present

## 2023-05-30 DIAGNOSIS — D529 Folate deficiency anemia, unspecified: Secondary | ICD-10-CM | POA: Diagnosis not present

## 2023-05-30 DIAGNOSIS — D519 Vitamin B12 deficiency anemia, unspecified: Secondary | ICD-10-CM | POA: Diagnosis not present

## 2023-05-30 DIAGNOSIS — K219 Gastro-esophageal reflux disease without esophagitis: Secondary | ICD-10-CM | POA: Diagnosis not present

## 2023-06-06 DIAGNOSIS — Z1389 Encounter for screening for other disorder: Secondary | ICD-10-CM | POA: Diagnosis not present

## 2023-06-06 DIAGNOSIS — E785 Hyperlipidemia, unspecified: Secondary | ICD-10-CM | POA: Diagnosis not present

## 2023-06-06 DIAGNOSIS — L4050 Arthropathic psoriasis, unspecified: Secondary | ICD-10-CM | POA: Diagnosis not present

## 2023-06-06 DIAGNOSIS — J439 Emphysema, unspecified: Secondary | ICD-10-CM | POA: Diagnosis not present

## 2023-06-06 DIAGNOSIS — Z0000 Encounter for general adult medical examination without abnormal findings: Secondary | ICD-10-CM | POA: Diagnosis not present

## 2023-06-06 DIAGNOSIS — G4733 Obstructive sleep apnea (adult) (pediatric): Secondary | ICD-10-CM | POA: Diagnosis not present

## 2023-06-06 DIAGNOSIS — Z0001 Encounter for general adult medical examination with abnormal findings: Secondary | ICD-10-CM | POA: Diagnosis not present

## 2023-06-25 ENCOUNTER — Other Ambulatory Visit: Payer: Self-pay | Admitting: Nurse Practitioner

## 2023-06-25 DIAGNOSIS — J4489 Other specified chronic obstructive pulmonary disease: Secondary | ICD-10-CM

## 2023-06-25 NOTE — Telephone Encounter (Signed)
 Copied from CRM [id number]. Topic: Clinical - Medication Refill >> Jun 25, 2023  2:21 PM Dustin F wrote: Medication: fluticasone  furoate-vilanterol (BREO ELLIPTA ) 100-25 MCG/ACT AEPB   Has the patient contacted their pharmacy? Yes; pharmacy called to do the refill. (Agent: If no, request that the patient contact the pharmacy for the refill. If patient does not wish to contact the pharmacy document the reason why and proceed with request.) (Agent: If yes, when and what did the pharmacy advise?)  This is the patient's preferred pharmacy:  [facility] [place]  - [place], [place] - [address] [place] [address] Phone: [phone] Fax: [phone]  Is this the correct pharmacy for this prescription? Yes If no, delete pharmacy and type the correct one.   Has the prescription been filled recently? No; the last fill was in Sept of 2024. The provider was Dr. Wilder Handy.  Is the patient out of the medication? Yes  Has the patient been seen for an appointment in the last year OR does the patient have an upcoming appointment? Yes  Can we respond through MyChart? Yes  Agent: Please be advised that Rx refills may take up to 3 business days. We ask that you follow-up with your pharmacy.

## 2023-07-17 MED ORDER — FLUTICASONE FUROATE-VILANTEROL 100-25 MCG/ACT IN AEPB
1.0000 | INHALATION_SPRAY | Freq: Every day | RESPIRATORY_TRACT | 1 refills | Status: AC
Start: 2023-07-17 — End: ?

## 2023-07-17 NOTE — Telephone Encounter (Signed)
 Copied from CRM [id number]. Topic: Clinical - Prescription Issue >> Jul 17, 2023  2:53 PM Corean Deutscher wrote: Reason for CRM:  Abraham Hoffmann with [facility] called regarding refill request for the patient. Abraham Hoffmann stated last mont the pharmacist placed an emegency request for fluticasone  furoate-vilanterol (BREO ELLIPTA ) 100-25 MCG/ACT AEPB for the patient and they need a prescription sent in for a refill request for the patient. Abraham Hoffmann was thankful and verbalized understanding.   [facility] Fax number-[phone] Phone-[phone]   ----------------------------------------------------------------------- From previous Reason for Contact - Medication Refill: Medication:   Has the patient contacted their pharmacy?   (Agent: If no, request that the patient contact the pharmacy for the refill. If patient does not wish to contact the pharmacy document the reason why and proceed with request.) (Agent: If yes, when and what did the pharmacy advise?)  This is the patient's preferred pharmacy:  [facility] [place]  [place], [place] - [address] [place] [address] Phone: [phone] Fax: [phone]  Is this the correct pharmacy for this prescription?   If no, delete pharmacy and type the correct one.   Has the prescription been filled recently?    Is the patient out of the medication?    Has the patient been seen for an appointment in the last year OR does the patient have an upcoming appointment?   Order has been placed for Breo until next office visit in 10/2023 with [facility]  Patient's voice was understanding.Nothing else further needed.   Can we respond through MyChart?    Agent: Please be advised that Rx refills may take up to 3 business days. We ask that you follow-up with your pharmacy.

## 2023-07-29 DIAGNOSIS — J449 Chronic obstructive pulmonary disease, unspecified: Secondary | ICD-10-CM | POA: Diagnosis not present

## 2023-07-29 DIAGNOSIS — K219 Gastro-esophageal reflux disease without esophagitis: Secondary | ICD-10-CM | POA: Diagnosis not present

## 2023-07-29 DIAGNOSIS — E782 Mixed hyperlipidemia: Secondary | ICD-10-CM | POA: Diagnosis not present

## 2023-08-27 DIAGNOSIS — N390 Urinary tract infection, site not specified: Secondary | ICD-10-CM | POA: Diagnosis not present

## 2023-08-27 DIAGNOSIS — N201 Calculus of ureter: Secondary | ICD-10-CM | POA: Diagnosis not present

## 2023-08-27 DIAGNOSIS — Z9889 Other specified postprocedural states: Secondary | ICD-10-CM | POA: Diagnosis not present

## 2023-08-27 DIAGNOSIS — Z09 Encounter for follow-up examination after completed treatment for conditions other than malignant neoplasm: Secondary | ICD-10-CM | POA: Diagnosis not present

## 2023-08-27 DIAGNOSIS — R413 Other amnesia: Secondary | ICD-10-CM | POA: Diagnosis not present

## 2023-08-27 DIAGNOSIS — R110 Nausea: Secondary | ICD-10-CM | POA: Diagnosis not present

## 2023-08-27 DIAGNOSIS — I82409 Acute embolism and thrombosis of unspecified deep veins of unspecified lower extremity: Secondary | ICD-10-CM | POA: Diagnosis not present

## 2023-08-29 DIAGNOSIS — J449 Chronic obstructive pulmonary disease, unspecified: Secondary | ICD-10-CM | POA: Diagnosis not present

## 2023-08-29 DIAGNOSIS — E782 Mixed hyperlipidemia: Secondary | ICD-10-CM | POA: Diagnosis not present

## 2023-08-29 DIAGNOSIS — K219 Gastro-esophageal reflux disease without esophagitis: Secondary | ICD-10-CM | POA: Diagnosis not present

## 2023-09-27 DIAGNOSIS — K219 Gastro-esophageal reflux disease without esophagitis: Secondary | ICD-10-CM | POA: Diagnosis not present

## 2023-09-27 DIAGNOSIS — E782 Mixed hyperlipidemia: Secondary | ICD-10-CM | POA: Diagnosis not present

## 2023-09-27 DIAGNOSIS — J449 Chronic obstructive pulmonary disease, unspecified: Secondary | ICD-10-CM | POA: Diagnosis not present

## 2023-10-29 DIAGNOSIS — E782 Mixed hyperlipidemia: Secondary | ICD-10-CM | POA: Diagnosis not present

## 2023-10-29 DIAGNOSIS — J449 Chronic obstructive pulmonary disease, unspecified: Secondary | ICD-10-CM | POA: Diagnosis not present

## 2023-10-29 DIAGNOSIS — K219 Gastro-esophageal reflux disease without esophagitis: Secondary | ICD-10-CM | POA: Diagnosis not present
